# Patient Record
Sex: Female | Born: 1940 | Race: White | Hispanic: No | Marital: Married | Smoking: Former smoker
Health system: Southern US, Community
[De-identification: ages and names within clinical notes are randomized; demographics above are authoritative.]

## PROBLEM LIST (undated history)

## (undated) DIAGNOSIS — Z87891 Personal history of nicotine dependence: Secondary | ICD-10-CM

## (undated) DIAGNOSIS — N6019 Diffuse cystic mastopathy of unspecified breast: Secondary | ICD-10-CM

## (undated) DIAGNOSIS — Z1211 Encounter for screening for malignant neoplasm of colon: Secondary | ICD-10-CM

## (undated) DIAGNOSIS — I10 Essential (primary) hypertension: Secondary | ICD-10-CM

## (undated) DIAGNOSIS — M1990 Unspecified osteoarthritis, unspecified site: Secondary | ICD-10-CM

## (undated) DIAGNOSIS — R011 Cardiac murmur, unspecified: Secondary | ICD-10-CM

## (undated) DIAGNOSIS — F0390 Unspecified dementia without behavioral disturbance: Secondary | ICD-10-CM

## (undated) DIAGNOSIS — K219 Gastro-esophageal reflux disease without esophagitis: Secondary | ICD-10-CM

## (undated) HISTORY — DX: Encounter for screening for malignant neoplasm of colon: Z12.11

## (undated) HISTORY — DX: Personal history of nicotine dependence: Z87.891

## (undated) HISTORY — DX: Cardiac murmur, unspecified: R01.1

## (undated) HISTORY — PX: HERNIA REPAIR: SHX51

## (undated) HISTORY — DX: Essential (primary) hypertension: I10

## (undated) HISTORY — PX: BREAST BIOPSY: SHX20

## (undated) HISTORY — DX: Unspecified osteoarthritis, unspecified site: M19.90

## (undated) HISTORY — DX: Diffuse cystic mastopathy of unspecified breast: N60.19

## (undated) HISTORY — DX: Gastro-esophageal reflux disease without esophagitis: K21.9

---

## 1978-03-13 HISTORY — PX: ABDOMINAL HYSTERECTOMY: SHX81

## 2003-03-14 DIAGNOSIS — R011 Cardiac murmur, unspecified: Secondary | ICD-10-CM

## 2003-03-14 HISTORY — PX: BREAST SURGERY: SHX581

## 2003-03-14 HISTORY — PX: THYROIDECTOMY, PARTIAL: SHX18

## 2003-03-14 HISTORY — DX: Cardiac murmur, unspecified: R01.1

## 2004-11-03 ENCOUNTER — Ambulatory Visit: Payer: Self-pay | Admitting: General Surgery

## 2005-11-22 ENCOUNTER — Ambulatory Visit: Payer: Self-pay | Admitting: General Surgery

## 2006-11-26 ENCOUNTER — Ambulatory Visit: Payer: Self-pay | Admitting: General Surgery

## 2007-03-14 DIAGNOSIS — I10 Essential (primary) hypertension: Secondary | ICD-10-CM

## 2007-03-14 HISTORY — DX: Essential (primary) hypertension: I10

## 2007-11-28 ENCOUNTER — Ambulatory Visit: Payer: Self-pay | Admitting: General Surgery

## 2008-03-13 HISTORY — PX: COLONOSCOPY: SHX174

## 2008-11-30 ENCOUNTER — Ambulatory Visit: Payer: Self-pay | Admitting: General Surgery

## 2009-01-08 ENCOUNTER — Ambulatory Visit: Payer: Self-pay | Admitting: General Surgery

## 2009-03-13 DIAGNOSIS — M1990 Unspecified osteoarthritis, unspecified site: Secondary | ICD-10-CM

## 2009-03-13 HISTORY — DX: Unspecified osteoarthritis, unspecified site: M19.90

## 2009-12-02 ENCOUNTER — Ambulatory Visit: Payer: Self-pay | Admitting: General Surgery

## 2010-10-16 ENCOUNTER — Ambulatory Visit: Payer: Self-pay | Admitting: Internal Medicine

## 2010-12-05 ENCOUNTER — Ambulatory Visit: Payer: Self-pay | Admitting: General Surgery

## 2011-12-12 ENCOUNTER — Ambulatory Visit: Payer: Self-pay | Admitting: General Surgery

## 2012-08-19 ENCOUNTER — Encounter: Payer: Self-pay | Admitting: *Deleted

## 2012-12-12 ENCOUNTER — Ambulatory Visit: Payer: Self-pay | Admitting: General Surgery

## 2012-12-13 ENCOUNTER — Encounter: Payer: Self-pay | Admitting: General Surgery

## 2012-12-23 ENCOUNTER — Ambulatory Visit (INDEPENDENT_AMBULATORY_CARE_PROVIDER_SITE_OTHER): Payer: Medicare Other | Admitting: General Surgery

## 2012-12-23 ENCOUNTER — Encounter: Payer: Self-pay | Admitting: General Surgery

## 2012-12-23 VITALS — BP 130/82 | HR 64 | Resp 12 | Ht 67.0 in | Wt 148.0 lb

## 2012-12-23 DIAGNOSIS — Z1239 Encounter for other screening for malignant neoplasm of breast: Secondary | ICD-10-CM

## 2012-12-23 DIAGNOSIS — N6019 Diffuse cystic mastopathy of unspecified breast: Secondary | ICD-10-CM

## 2012-12-23 NOTE — Patient Instructions (Addendum)
Patient to continue self breast checks. She is to contact our office with any new questions or concerns. Patient to return in 1 year with bilateral screening mammogram.

## 2012-12-23 NOTE — Progress Notes (Signed)
Patient ID: Shannon Rios, female   DOB: 02-09-1941, 72 y.o.   MRN: [id number]  Chief Complaint  Patient presents with  . Other    follow up 1 year screening mammogram    HPI [name] is a 72 y.o. female who presents for a breast evaluation. The most recent mammogram was done on 12/12/12.  Patient does perform regular self breast checks and gets regular mammograms done.  The patient denies any problems with the breast at this time.    HPI  Past Medical History  Diagnosis Date  . GERD (gastroesophageal reflux disease)   . Hypertension 2009  . Personal history of tobacco use, presenting hazards to health   . Heart murmur 2005  . Diffuse cystic mastopathy   . Special screening for malignant neoplasms, colon   . 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 Thyroidectomy, partial  2005  . Abdominal hysterectomy  1980    with salpingo oophorectomy  . Breast surgery  2005    biopsy  . Hernia repair    . Colonoscopy  2010    Dr. Evette Cristal    History reviewed. No pertinent family history.  Social History History  Substance Use Topics  . Smoking status: Former Smoker -- 0.50 packs/day for 10 years    Types: Cigarettes  . Smokeless tobacco: Not on file  . Alcohol Use: Yes     Comment: socially    No Known Allergies  Current Outpatient Prescriptions  Medication Sig Dispense Refill  . benazepril (LOTENSIN) 40 MG tablet Take 1 tablet by mouth daily.      . Cholecalciferol (VITAMIN D-3) 1000 UNITS CAPS Take 1 capsule by mouth daily.      . Coenzyme Q10 (CO Q 10 PO) Take 200 mg by mouth daily.      . Cyanocobalamin (VITAMIN B 12 PO) Take 2,500 mg by mouth daily.      . Glucosamine Sulfate 1000 MG CAPS Take 1 capsule by mouth 2 (two) times daily.      . hydrochlorothiazide (HYDRODIURIL) 25 MG tablet Take 1 tablet by mouth daily.      . Multiple Vitamin (MULTIVITAMIN) tablet Take 1 tablet by mouth daily.      . Omega-3 Fatty Acids (FISH  OIL) 1200 MG CAPS Take 1 capsule by mouth as needed.      . Red Yeast Rice Extract (RED YEAST RICE PO) Take 1,200 mg by mouth daily.      . vitamin E 1000 UNIT capsule Take 1,000 Units by mouth as needed.       No current facility-administered medications for this visit.    Review of Systems Review of Systems  Constitutional: Negative.   Respiratory: Negative.   Cardiovascular: Negative.     Blood pressure 130/82, pulse 64, resp. rate 12, height 5\' 7"  (1.702 m), weight 148 lb (67.132 kg).  Physical Exam Physical Exam  Constitutional: She is oriented to person, place, and time. She appears well-developed and well-nourished.  Eyes: Conjunctivae are normal. No scleral icterus.  Neck: No thyromegaly present.  Cardiovascular: Normal rate, regular rhythm and normal heart sounds.   No murmur heard. Pulmonary/Chest: Effort normal and breath sounds normal. Right breast exhibits no inverted nipple, no mass, no nipple discharge, no skin change and no tenderness. Left breast exhibits no inverted nipple, no mass, no nipple discharge, no skin change and no tenderness.  Lymphadenopathy:    She has no  cervical adenopathy.    She has no axillary adenopathy.  Neurological: She is alert and oriented to person, place, and time.  Skin: Skin is warm and dry.    Data Reviewed Mammogram stable.   Assessment    Exam stable. History of FCD.    Plan    Patient to return in 1 year with bilateral screening mammogram.        Gerlene Burdock G 12/23/2012, 12:25 PM

## 2013-09-01 DIAGNOSIS — I10 Essential (primary) hypertension: Secondary | ICD-10-CM | POA: Insufficient documentation

## 2013-12-16 ENCOUNTER — Ambulatory Visit: Payer: Self-pay | Admitting: Internal Medicine

## 2014-01-07 DIAGNOSIS — R7302 Impaired glucose tolerance (oral): Secondary | ICD-10-CM | POA: Insufficient documentation

## 2014-01-07 DIAGNOSIS — E785 Hyperlipidemia, unspecified: Secondary | ICD-10-CM | POA: Insufficient documentation

## 2014-01-08 ENCOUNTER — Ambulatory Visit (INDEPENDENT_AMBULATORY_CARE_PROVIDER_SITE_OTHER): Payer: Medicare HMO | Admitting: General Surgery

## 2014-01-08 ENCOUNTER — Encounter: Payer: Self-pay | Admitting: General Surgery

## 2014-01-08 ENCOUNTER — Ambulatory Visit: Payer: Medicare Other | Admitting: General Surgery

## 2014-01-08 VITALS — BP 150/82 | HR 68 | Resp 12 | Ht 67.0 in | Wt 145.0 lb

## 2014-01-08 DIAGNOSIS — Z1239 Encounter for other screening for malignant neoplasm of breast: Secondary | ICD-10-CM

## 2014-01-08 DIAGNOSIS — N6019 Diffuse cystic mastopathy of unspecified breast: Secondary | ICD-10-CM

## 2014-01-08 NOTE — Progress Notes (Signed)
Patient ID: Shannon Rios Vegh, female   DOB: 10/19/1940, 73 y.o.   MRN: [id number]  Chief Complaint  Patient presents with  . Follow-up    1 year mammogram     HPI Shannon Rios Pannone is a 73 y.o. female who presents for a breast evaluation. The most recent mammogram was done on 12/16/13. Patient does perform regular self breast checks and gets regular mammograms done.  The patient denies any new problems with the breasts at this time.    HPI  Past Medical History  Diagnosis Date  . GERD (gastroesophageal reflux disease)   . Hypertension 2009  . Personal history of tobacco use, presenting hazards to health   . Heart murmur 2005  . Diffuse cystic mastopathy   . Special screening for malignant neoplasms, colon   . 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 Thyroidectomy, partial  2005  . Abdominal hysterectomy  1980    with salpingo oophorectomy  . Breast surgery  2005    biopsy  . Hernia repair    . Colonoscopy  2010    Dr. Evette CristalSankar    History reviewed. No pertinent family history.  Social History History  Substance Use Topics  . Smoking status: Former Smoker -- 0.50 packs/day for 10 years    Types: Cigarettes  . Smokeless tobacco: Not on file  . Alcohol Use: Yes     Comment: socially    No Known Allergies  Current Outpatient Prescriptions  Medication Sig Dispense Refill  . benazepril (LOTENSIN) 40 MG tablet Take 1 tablet by mouth daily.      . Cholecalciferol (VITAMIN D-3) 1000 UNITS CAPS Take 1 capsule by mouth daily.      . Glucosamine Sulfate 1000 MG CAPS Take 1 capsule by mouth 2 (two) times daily.      . hydrochlorothiazide (HYDRODIURIL) 25 MG tablet Take 1 tablet by mouth daily.      . Multiple Vitamin (MULTIVITAMIN) tablet Take 1 tablet by mouth daily.      . vitamin E 1000 UNIT capsule Take 1,000 Units by mouth as needed.       No current facility-administered medications for this visit.    Review of  Systems Review of Systems  Constitutional: Negative.   Respiratory: Negative.   Cardiovascular: Negative.     Blood pressure 150/82, pulse 68, resp. rate 12, height 5\' 7"  (1.702 m), weight 145 lb (65.772 kg).  Physical Exam Physical Exam  Constitutional: She is oriented to person, place, and time. She appears well-developed and well-nourished.  Eyes: Conjunctivae are normal. No scleral icterus.  Neck: Neck supple. No thyromegaly present.  Cardiovascular: Normal rate, regular rhythm and normal heart sounds.   No murmur heard. Pulmonary/Chest: Effort normal and breath sounds normal. Right breast exhibits no inverted nipple, no mass, no nipple discharge, no skin change and no tenderness. Left breast exhibits no inverted nipple, no mass, no nipple discharge, no skin change and no tenderness.  Abdominal: Soft. Bowel sounds are normal. There is no tenderness.  Lymphadenopathy:    She has no cervical adenopathy.    She has no axillary adenopathy.  Neurological: She is alert and oriented to person, place, and time.  Skin: Skin is warm and dry.    Data Reviewed  Mammogram reviewed and stable.   Assessment    Stable exam. FCD.     Plan    Patient to return in 1 year with bilateral screening  mammogram.        Kieth BrightlySANKAR,Eldonna Neuenfeldt G 01/08/2014, 6:29 PM

## 2014-01-08 NOTE — Patient Instructions (Signed)
Patient to return in 1 year with bilateral screening mammogram. Continue self breast exams. Call office for any new breast issues or concerns.  

## 2014-01-12 ENCOUNTER — Encounter: Payer: Self-pay | Admitting: General Surgery

## 2014-01-12 DIAGNOSIS — J309 Allergic rhinitis, unspecified: Secondary | ICD-10-CM | POA: Insufficient documentation

## 2014-01-12 DIAGNOSIS — M8580 Other specified disorders of bone density and structure, unspecified site: Secondary | ICD-10-CM | POA: Insufficient documentation

## 2014-07-15 DIAGNOSIS — E559 Vitamin D deficiency, unspecified: Secondary | ICD-10-CM | POA: Insufficient documentation

## 2014-10-26 ENCOUNTER — Other Ambulatory Visit: Payer: Self-pay

## 2014-10-26 DIAGNOSIS — Z1231 Encounter for screening mammogram for malignant neoplasm of breast: Secondary | ICD-10-CM

## 2014-12-22 ENCOUNTER — Ambulatory Visit
Admission: RE | Admit: 2014-12-22 | Discharge: 2014-12-22 | Disposition: A | Discharge status: Home / Self Care | Payer: Medicare PPO | Source: Ambulatory Visit | Attending: Internal Medicine | Admitting: Internal Medicine

## 2014-12-22 DIAGNOSIS — Z1231 Encounter for screening mammogram for malignant neoplasm of breast: Secondary | ICD-10-CM | POA: Diagnosis present

## 2014-12-29 ENCOUNTER — Ambulatory Visit: Payer: Medicare HMO | Admitting: General Surgery

## 2016-01-10 ENCOUNTER — Other Ambulatory Visit: Payer: Self-pay | Admitting: Internal Medicine

## 2016-01-18 DIAGNOSIS — M5030 Other cervical disc degeneration, unspecified cervical region: Secondary | ICD-10-CM | POA: Insufficient documentation

## 2016-06-04 DIAGNOSIS — F03B18 Unspecified dementia, moderate, with other behavioral disturbance: Secondary | ICD-10-CM | POA: Insufficient documentation

## 2016-06-04 DIAGNOSIS — F0391 Unspecified dementia with behavioral disturbance: Secondary | ICD-10-CM | POA: Insufficient documentation

## 2016-07-19 DIAGNOSIS — M170 Bilateral primary osteoarthritis of knee: Secondary | ICD-10-CM | POA: Insufficient documentation

## 2016-07-31 ENCOUNTER — Other Ambulatory Visit: Payer: Self-pay | Admitting: Gastroenterology

## 2016-07-31 DIAGNOSIS — R1310 Dysphagia, unspecified: Secondary | ICD-10-CM

## 2016-08-03 ENCOUNTER — Ambulatory Visit: Payer: Medicare PPO

## 2016-08-08 ENCOUNTER — Ambulatory Visit
Admission: RE | Admit: 2016-08-08 | Discharge: 2016-08-08 | Disposition: A | Discharge status: Home / Self Care | Payer: Medicare PPO | Source: Ambulatory Visit | Attending: Gastroenterology | Admitting: Gastroenterology

## 2016-08-08 DIAGNOSIS — R1310 Dysphagia, unspecified: Secondary | ICD-10-CM

## 2016-08-08 DIAGNOSIS — K449 Diaphragmatic hernia without obstruction or gangrene: Secondary | ICD-10-CM | POA: Insufficient documentation

## 2016-08-08 DIAGNOSIS — R1314 Dysphagia, pharyngoesophageal phase: Secondary | ICD-10-CM | POA: Diagnosis present

## 2016-09-04 ENCOUNTER — Ambulatory Visit: Admission: RE | Admit: 2016-09-04 | Payer: Medicare PPO | Source: Ambulatory Visit | Admitting: Gastroenterology

## 2016-09-04 ENCOUNTER — Encounter: Admission: RE | Payer: Self-pay | Source: Ambulatory Visit

## 2016-09-04 SURGERY — ESOPHAGOGASTRODUODENOSCOPY (EGD) WITH PROPOFOL
Anesthesia: General

## 2016-09-08 DIAGNOSIS — M1712 Unilateral primary osteoarthritis, left knee: Secondary | ICD-10-CM | POA: Insufficient documentation

## 2016-09-08 DIAGNOSIS — M1711 Unilateral primary osteoarthritis, right knee: Secondary | ICD-10-CM | POA: Insufficient documentation

## 2016-09-25 DIAGNOSIS — F39 Unspecified mood [affective] disorder: Secondary | ICD-10-CM | POA: Insufficient documentation

## 2016-09-26 ENCOUNTER — Other Ambulatory Visit: Payer: Self-pay | Admitting: Orthopedic Surgery

## 2016-09-26 DIAGNOSIS — M2392 Unspecified internal derangement of left knee: Secondary | ICD-10-CM

## 2016-09-26 DIAGNOSIS — M1712 Unilateral primary osteoarthritis, left knee: Secondary | ICD-10-CM

## 2016-10-03 ENCOUNTER — Ambulatory Visit: Payer: Medicare PPO

## 2016-10-05 ENCOUNTER — Ambulatory Visit
Admission: RE | Admit: 2016-10-05 | Discharge: 2016-10-05 | Disposition: A | Discharge status: Home / Self Care | Payer: Medicare PPO | Source: Ambulatory Visit | Attending: Orthopedic Surgery | Admitting: Orthopedic Surgery

## 2016-10-05 DIAGNOSIS — M2392 Unspecified internal derangement of left knee: Secondary | ICD-10-CM

## 2016-10-05 DIAGNOSIS — M84462A Pathological fracture, left tibia, initial encounter for fracture: Secondary | ICD-10-CM | POA: Diagnosis not present

## 2016-10-05 DIAGNOSIS — S83282A Other tear of lateral meniscus, current injury, left knee, initial encounter: Secondary | ICD-10-CM | POA: Diagnosis not present

## 2016-10-05 DIAGNOSIS — M25461 Effusion, right knee: Secondary | ICD-10-CM | POA: Diagnosis not present

## 2016-10-05 DIAGNOSIS — S83242A Other tear of medial meniscus, current injury, left knee, initial encounter: Secondary | ICD-10-CM | POA: Diagnosis not present

## 2016-10-05 DIAGNOSIS — X58XXXA Exposure to other specified factors, initial encounter: Secondary | ICD-10-CM | POA: Insufficient documentation

## 2016-10-05 DIAGNOSIS — M948X6 Other specified disorders of cartilage, lower leg: Secondary | ICD-10-CM | POA: Insufficient documentation

## 2016-10-05 DIAGNOSIS — M1712 Unilateral primary osteoarthritis, left knee: Secondary | ICD-10-CM | POA: Insufficient documentation

## 2016-10-26 ENCOUNTER — Other Ambulatory Visit: Payer: Self-pay | Admitting: Gastroenterology

## 2016-10-26 DIAGNOSIS — R1013 Epigastric pain: Secondary | ICD-10-CM

## 2016-10-31 ENCOUNTER — Ambulatory Visit
Admission: RE | Admit: 2016-10-31 | Discharge: 2016-10-31 | Disposition: A | Discharge status: Home / Self Care | Payer: Medicare PPO | Source: Ambulatory Visit | Attending: Gastroenterology | Admitting: Gastroenterology

## 2016-10-31 DIAGNOSIS — K219 Gastro-esophageal reflux disease without esophagitis: Secondary | ICD-10-CM | POA: Diagnosis not present

## 2016-10-31 DIAGNOSIS — K449 Diaphragmatic hernia without obstruction or gangrene: Secondary | ICD-10-CM | POA: Diagnosis not present

## 2016-10-31 DIAGNOSIS — K228 Other specified diseases of esophagus: Secondary | ICD-10-CM | POA: Diagnosis not present

## 2016-10-31 DIAGNOSIS — R1013 Epigastric pain: Secondary | ICD-10-CM | POA: Diagnosis present

## 2017-02-08 ENCOUNTER — Ambulatory Visit: Payer: Medicare PPO

## 2017-02-08 ENCOUNTER — Encounter (INDEPENDENT_AMBULATORY_CARE_PROVIDER_SITE_OTHER): Payer: Self-pay

## 2017-02-08 ENCOUNTER — Other Ambulatory Visit
Admission: RE | Admit: 2017-02-08 | Discharge: 2017-02-08 | Disposition: A | Discharge status: Home / Self Care | Payer: Medicare PPO | Source: Ambulatory Visit | Attending: Unknown Physician Specialty | Admitting: Unknown Physician Specialty

## 2017-02-08 ENCOUNTER — Other Ambulatory Visit: Payer: Self-pay | Admitting: Unknown Physician Specialty

## 2017-02-08 ENCOUNTER — Other Ambulatory Visit (INDEPENDENT_AMBULATORY_CARE_PROVIDER_SITE_OTHER): Payer: Medicare HMO

## 2017-02-08 ENCOUNTER — Other Ambulatory Visit (INDEPENDENT_AMBULATORY_CARE_PROVIDER_SITE_OTHER): Payer: Self-pay | Admitting: Unknown Physician Specialty

## 2017-02-08 DIAGNOSIS — M7989 Other specified soft tissue disorders: Principal | ICD-10-CM

## 2017-02-08 DIAGNOSIS — M1712 Unilateral primary osteoarthritis, left knee: Secondary | ICD-10-CM | POA: Insufficient documentation

## 2017-02-08 DIAGNOSIS — M79662 Pain in left lower leg: Secondary | ICD-10-CM

## 2017-02-08 LAB — SYNOVIAL CELL COUNT + DIFF, W/ CRYSTALS
CRYSTALS FLUID: NONE SEEN
EOSINOPHILS-SYNOVIAL: 1 %
LYMPHOCYTES-SYNOVIAL FLD: 33 %
MONOCYTE-MACROPHAGE-SYNOVIAL FLUID: 23 %
NEUTROPHIL, SYNOVIAL: 43 %
WBC, SYNOVIAL: 169 /mm3 (ref 0–200)

## 2017-07-24 DIAGNOSIS — I890 Lymphedema, not elsewhere classified: Secondary | ICD-10-CM | POA: Insufficient documentation

## 2017-08-30 ENCOUNTER — Other Ambulatory Visit: Payer: Self-pay | Admitting: Specialist

## 2017-08-30 DIAGNOSIS — R05 Cough: Secondary | ICD-10-CM

## 2017-08-30 DIAGNOSIS — R0602 Shortness of breath: Secondary | ICD-10-CM

## 2017-08-30 DIAGNOSIS — R053 Chronic cough: Secondary | ICD-10-CM

## 2017-08-30 DIAGNOSIS — R942 Abnormal results of pulmonary function studies: Secondary | ICD-10-CM

## 2017-09-07 ENCOUNTER — Ambulatory Visit
Admission: RE | Admit: 2017-09-07 | Discharge: 2017-09-07 | Disposition: A | Discharge status: Home / Self Care | Payer: Medicare PPO | Source: Ambulatory Visit | Attending: Specialist | Admitting: Specialist

## 2017-09-07 DIAGNOSIS — R05 Cough: Secondary | ICD-10-CM | POA: Diagnosis present

## 2017-09-07 DIAGNOSIS — K219 Gastro-esophageal reflux disease without esophagitis: Secondary | ICD-10-CM | POA: Diagnosis not present

## 2017-09-07 DIAGNOSIS — I700 Atherosclerosis of aorta: Secondary | ICD-10-CM | POA: Diagnosis not present

## 2017-09-07 DIAGNOSIS — R942 Abnormal results of pulmonary function studies: Secondary | ICD-10-CM | POA: Diagnosis present

## 2017-09-07 DIAGNOSIS — R0602 Shortness of breath: Secondary | ICD-10-CM | POA: Insufficient documentation

## 2017-09-07 DIAGNOSIS — K449 Diaphragmatic hernia without obstruction or gangrene: Secondary | ICD-10-CM | POA: Insufficient documentation

## 2017-09-07 DIAGNOSIS — R053 Chronic cough: Secondary | ICD-10-CM

## 2017-09-20 DIAGNOSIS — R32 Unspecified urinary incontinence: Secondary | ICD-10-CM | POA: Insufficient documentation

## 2017-12-08 ENCOUNTER — Emergency Department
Admission: EM | Admit: 2017-12-08 | Discharge: 2017-12-09 | Disposition: A | Discharge status: Home / Self Care | Payer: Medicare PPO | Attending: Student in an Organized Health Care Education/Training Program | Admitting: Student in an Organized Health Care Education/Training Program

## 2017-12-08 ENCOUNTER — Encounter: Payer: Self-pay | Admitting: Emergency Medicine

## 2017-12-08 ENCOUNTER — Other Ambulatory Visit: Payer: Self-pay

## 2017-12-08 DIAGNOSIS — F17210 Nicotine dependence, cigarettes, uncomplicated: Secondary | ICD-10-CM | POA: Insufficient documentation

## 2017-12-08 DIAGNOSIS — I10 Essential (primary) hypertension: Secondary | ICD-10-CM | POA: Diagnosis not present

## 2017-12-08 DIAGNOSIS — F0390 Unspecified dementia without behavioral disturbance: Secondary | ICD-10-CM | POA: Insufficient documentation

## 2017-12-08 DIAGNOSIS — N3001 Acute cystitis with hematuria: Secondary | ICD-10-CM

## 2017-12-08 DIAGNOSIS — Z79899 Other long term (current) drug therapy: Secondary | ICD-10-CM | POA: Diagnosis not present

## 2017-12-08 DIAGNOSIS — R319 Hematuria, unspecified: Secondary | ICD-10-CM | POA: Diagnosis present

## 2017-12-08 HISTORY — DX: Unspecified dementia, unspecified severity, without behavioral disturbance, psychotic disturbance, mood disturbance, and anxiety: F03.90

## 2017-12-08 LAB — URINALYSIS, COMPLETE (UACMP) WITH MICROSCOPIC
Bacteria, UA: NONE SEEN
SQUAMOUS EPITHELIAL / LPF: NONE SEEN (ref 0–5)
Specific Gravity, Urine: 1.018 (ref 1.005–1.030)

## 2017-12-08 LAB — CBC WITH DIFFERENTIAL/PLATELET
Basophils Absolute: 0.1 10*3/uL (ref 0–0.1)
Basophils Relative: 1 %
EOS PCT: 0 %
Eosinophils Absolute: 0 10*3/uL (ref 0–0.7)
HEMATOCRIT: 35.5 % (ref 35.0–47.0)
Hemoglobin: 12.5 g/dL (ref 12.0–16.0)
LYMPHS ABS: 1.4 10*3/uL (ref 1.0–3.6)
LYMPHS PCT: 12 %
MCH: 32.5 pg (ref 26.0–34.0)
MCHC: 35.3 g/dL (ref 32.0–36.0)
MCV: 91.9 fL (ref 80.0–100.0)
MONO ABS: 1 10*3/uL — AB (ref 0.2–0.9)
Monocytes Relative: 9 %
NEUTROS ABS: 9.5 10*3/uL — AB (ref 1.4–6.5)
Neutrophils Relative %: 78 %
PLATELETS: 368 10*3/uL (ref 150–440)
RBC: 3.86 MIL/uL (ref 3.80–5.20)
RDW: 14.1 % (ref 11.5–14.5)
WBC: 12.1 10*3/uL — ABNORMAL HIGH (ref 3.6–11.0)

## 2017-12-08 LAB — BASIC METABOLIC PANEL
ANION GAP: 11 (ref 5–15)
BUN: 22 mg/dL (ref 8–23)
CALCIUM: 9.5 mg/dL (ref 8.9–10.3)
CO2: 31 mmol/L (ref 22–32)
Chloride: 92 mmol/L — ABNORMAL LOW (ref 98–111)
Creatinine, Ser: 0.9 mg/dL (ref 0.44–1.00)
GFR calc Af Amer: >60 mL/min (ref >60)
GFR calc non Af Amer: >60 mL/min (ref >60)
GLUCOSE: 134 mg/dL — AB (ref 70–99)
POTASSIUM: 3.1 mmol/L — AB (ref 3.5–5.1)
Sodium: 134 mmol/L — ABNORMAL LOW (ref 135–145)

## 2017-12-08 MED ORDER — SODIUM CHLORIDE 0.9 % IV SOLN
1.0000 g | Freq: Once | INTRAVENOUS | Status: AC
  Administered 2017-12-08: 1 g via INTRAVENOUS
  Filled 2017-12-08: qty 10

## 2017-12-08 MED ORDER — CEPHALEXIN 500 MG PO CAPS: 500.0000 mg | ORAL_CAPSULE | Freq: Three times a day (TID) | ORAL | 0 refills | Status: AC

## 2017-12-08 NOTE — ED Triage Notes (Signed)
Pt arrives POV to triage with c/o hematuria. Pt and spouse reports blood in urine with painful urination. Pt is in NAD.

## 2017-12-08 NOTE — ED Provider Notes (Signed)
[facility] Emergency Department Provider Note    First MD Initiated Contact with Patient 12/08/17 2023     (approximate)  I have reviewed the triage vital signs and the nursing notes.   HISTORY  Chief Complaint Hematuria  Level V Caveat:  dementia  HPI Shannon Rios is a 77 y.o. female presents the ER with 1 day of worsening hematuria and dysuria.  No recent antibiotics.  No confusion.  No fevers.  No nausea or vomiting.  States she does have significant discomfort when emptying her bladder.  She accompanied by family member.  No other concerns reported.    Past Medical History:  Diagnosis Date  . Dementia   . Diffuse cystic mastopathy   . GERD (gastroesophageal reflux disease)   . Heart murmur 2005  . Hypertension 2009  . 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 Personal history of tobacco use, presenting hazards to health   . Special screening for malignant neoplasms, colon    No family history on file. Past Surgical History:  Procedure Laterality Date  . ABDOMINAL HYSTERECTOMY  1980   with salpingo oophorectomy  . BREAST BIOPSY Bilateral    neg  . BREAST SURGERY  2005   biopsy  . COLONOSCOPY  2010   Dr. Evette Cristal  . HERNIA REPAIR    . THYROIDECTOMY, PARTIAL  2005   Patient Active Problem List   Diagnosis Date Noted  . Diffuse cystic mastopathy 12/23/2012      Prior to Admission medications   Medication Sig Start Date End Date Taking? Authorizing Provider  benazepril (LOTENSIN) 40 MG tablet Take 1 tablet by mouth daily. 10/24/12   [provider]  cephALEXin (KEFLEX) 500 MG capsule Take 1 capsule (500 mg total) by mouth 3 (three) times daily for 7 days. 12/08/17 12/15/17  Willy Eddy, MD  Cholecalciferol (VITAMIN D-3) 1000 UNITS CAPS Take 1 capsule by mouth daily.    [provider]  Glucosamine Sulfate 1000 MG CAPS Take 1 capsule by mouth 2 (two) times daily.    [provider]  hydrochlorothiazide  (HYDRODIURIL) 25 MG tablet Take 1 tablet by mouth daily. 11/16/12   [provider]  Multiple Vitamin (MULTIVITAMIN) tablet Take 1 tablet by mouth daily.    [provider]  vitamin E 1000 UNIT capsule Take 1,000 Units by mouth as needed.    [provider]    Allergies Patient has no known allergies.    Social History Social History   Tobacco Use  . Smoking status: Former Smoker    Packs/day: 0.50    Years: 10.00    Pack years: 5.00    Types: Cigarettes  . Smokeless tobacco: Never Used  Substance Use Topics  . Alcohol use: Yes    Comment: socially  . Drug use: No    Review of Systems Patient denies headaches, rhinorrhea, blurry vision, numbness, shortness of breath, chest pain, edema, cough, abdominal pain, nausea, vomiting, diarrhea, dysuria, fevers, rashes or hallucinations unless otherwise stated above in HPI. ____________________________________________   PHYSICAL EXAM:  VITAL SIGNS: Vitals:   12/08/17 2008 12/08/17 2257  BP: (!) 161/86 (!) 160/111  Pulse: 93 72  Resp: 18 18  Temp: 98.7 F (37.1 C)   SpO2: 100% 97%    Constitutional: Alert, pleasant  Eyes: Conjunctivae are normal.  Head: Atraumatic. Nose: No congestion/rhinnorhea. Mouth/Throat: Mucous membranes are moist.   Neck: No stridor. Painless ROM.  Cardiovascular: Normal rate, regular rhythm. Grossly normal heart sounds.  Good peripheral circulation. Respiratory: Normal respiratory effort.  No retractions. Lungs CTAB. Gastrointestinal: Soft and nontender. No distention. No abdominal bruits. No CVA tenderness. Genitourinary: deferred Musculoskeletal: No lower extremity tenderness nor edema.  No joint effusions. Neurologic:  Normal speech and language. No gross focal neurologic deficits are appreciated. No facial droop Skin:  Skin is warm, dry and intact. No rash noted. Psychiatric: Mood and affect are normal. Speech and behavior are  normal.  ____________________________________________   LABS (all labs ordered are listed, but only abnormal results are displayed)  Results for orders placed or performed during the hospital encounter of 12/08/17 (from the past 24 hour(s))  Urinalysis, Complete w Microscopic     Status: Abnormal   Collection Time: 12/08/17  8:39 PM  Result Value Ref Range   Color, Urine RED (A) YELLOW   APPearance CLOUDY (A) CLEAR   Specific Gravity, Urine 1.018 1.005 - 1.030   pH  5.0 - 8.0    TEST NOT REPORTED DUE TO COLOR INTERFERENCE OF URINE PIGMENT   Glucose, UA (A) NEGATIVE mg/dL    TEST NOT REPORTED DUE TO COLOR INTERFERENCE OF URINE PIGMENT   Hgb urine dipstick (A) NEGATIVE    TEST NOT REPORTED DUE TO COLOR INTERFERENCE OF URINE PIGMENT   Bilirubin Urine (A) NEGATIVE    TEST NOT REPORTED DUE TO COLOR INTERFERENCE OF URINE PIGMENT   Ketones, ur (A) NEGATIVE mg/dL    TEST NOT REPORTED DUE TO COLOR INTERFERENCE OF URINE PIGMENT   Protein, ur (A) NEGATIVE mg/dL    TEST NOT REPORTED DUE TO COLOR INTERFERENCE OF URINE PIGMENT   Nitrite (A) NEGATIVE    TEST NOT REPORTED DUE TO COLOR INTERFERENCE OF URINE PIGMENT   Leukocytes, UA (A) NEGATIVE    TEST NOT REPORTED DUE TO COLOR INTERFERENCE OF URINE PIGMENT   RBC / HPF >50 (H) 0 - 5 RBC/hpf   WBC, UA >50 (H) 0 - 5 WBC/hpf   Bacteria, UA NONE SEEN NONE SEEN   Squamous Epithelial / LPF NONE SEEN 0 - 5   WBC Clumps PRESENT    Non Squamous Epithelial PRESENT (A) NONE SEEN  CBC with Differential/Platelet     Status: Abnormal   Collection Time: 12/08/17  8:44 PM  Result Value Ref Range   WBC 12.1 (H) 3.6 - 11.0 K/uL   RBC 3.86 3.80 - 5.20 MIL/uL   Hemoglobin 12.5 12.0 - 16.0 g/dL   HCT 16.1 09.6 - 04.5 %   MCV 91.9 80.0 - 100.0 fL   MCH 32.5 26.0 - 34.0 pg   MCHC 35.3 32.0 - 36.0 g/dL   RDW 40.9 81.1 - 91.4 %   Platelets 368 150 - 440 K/uL   Neutrophils Relative % 78 %   Neutro Abs 9.5 (H) 1.4 - 6.5 K/uL   Lymphocytes Relative 12 %    Lymphs Abs 1.4 1.0 - 3.6 K/uL   Monocytes Relative 9 %   Monocytes Absolute 1.0 (H) 0.2 - 0.9 K/uL   Eosinophils Relative 0 %   Eosinophils Absolute 0.0 0 - 0.7 K/uL   Basophils Relative 1 %   Basophils Absolute 0.1 0 - 0.1 K/uL  Basic metabolic panel     Status: Abnormal   Collection Time: 12/08/17  8:44 PM  Result Value Ref Range   Sodium 134 (L) 135 - 145 mmol/L   Potassium 3.1 (L) 3.5 - 5.1 mmol/L   Chloride 92 (L) 98 - 111 mmol/L   CO2 31 22 - 32 mmol/L  Glucose, Bld 134 (H) 70 - 99 mg/dL   BUN 22 8 - 23 mg/dL   Creatinine, Ser 9.60 0.44 - 1.00 mg/dL   Calcium 9.5 8.9 - 45.4 mg/dL   GFR calc non Af Amer >60 >60 mL/min   GFR calc Af Amer >60 >60 mL/min   Anion gap 11 5 - 15   ____________________________________________ ______________________________  RADIOLOGY   ____________________________________________   PROCEDURES  Procedure(s) performed:  Procedures    Critical Care performed: no ____________________________________________   INITIAL IMPRESSION / ASSESSMENT AND PLAN / ED COURSE  Pertinent labs & imaging results that were available during my care of the patient were reviewed by me and considered in my medical decision making (see chart for details).   DDX: uti, cystitis, stone, malignancy, aki  Callia Swim is a 77 y.o. who presents to the ED with dysuria and hematuria starting today.  We will check urine as well as blood work.  Anticipate will have a urinary tract infection based on her presentation.  Does not appear septic or toxic at this time.  Clinical Course as of Dec 08 2308  Sat Dec 08, 2017  2118 Given hematuria and dysuria will initiate IV antibiotics with Rocephin.  Not febrile not tachycardic.  Will send urine for culture.   [PR]  2135 Repeat abdominal exam soft benign.  Patient without any history of kidney stones.  Will give dose of antibiotics and once completed do believe she will be stable and appropriate for discharge home.    [PR]    Clinical Course User Index [PR] Willy Eddy, MD     As part of my medical decision making, I reviewed the following data within the electronic MEDICAL RECORD NUMBER Nursing notes reviewed and incorporated, Labs reviewed, notes from prior ED visits.  ____________________________________________   FINAL CLINICAL IMPRESSION(S) / ED DIAGNOSES  Final diagnoses:  Acute cystitis with hematuria      NEW MEDICATIONS STARTED DURING THIS VISIT:  New Prescriptions   CEPHALEXIN (KEFLEX) 500 MG CAPSULE    Take 1 capsule (500 mg total) by mouth 3 (three) times daily for 7 days.     Note:  This document was prepared using Dragon voice recognition software and may include unintentional dictation errors.    Willy Eddy, MD 12/08/17 [phone]

## 2017-12-08 NOTE — ED Notes (Signed)
Pt is going to try to void at bedside at this time .

## 2017-12-11 LAB — URINE CULTURE: Culture: >=100000 — AB

## 2017-12-23 ENCOUNTER — Other Ambulatory Visit: Payer: Self-pay

## 2017-12-23 ENCOUNTER — Emergency Department
Admission: EM | Admit: 2017-12-23 | Discharge: 2017-12-23 | Disposition: A | Discharge status: Home / Self Care | Payer: Medicare PPO | Attending: Emergency Medicine | Admitting: Emergency Medicine

## 2017-12-23 DIAGNOSIS — F0390 Unspecified dementia without behavioral disturbance: Secondary | ICD-10-CM | POA: Diagnosis not present

## 2017-12-23 DIAGNOSIS — Z87891 Personal history of nicotine dependence: Secondary | ICD-10-CM | POA: Diagnosis not present

## 2017-12-23 DIAGNOSIS — Z79899 Other long term (current) drug therapy: Secondary | ICD-10-CM | POA: Insufficient documentation

## 2017-12-23 DIAGNOSIS — I10 Essential (primary) hypertension: Secondary | ICD-10-CM | POA: Insufficient documentation

## 2017-12-23 DIAGNOSIS — N390 Urinary tract infection, site not specified: Secondary | ICD-10-CM | POA: Diagnosis not present

## 2017-12-23 DIAGNOSIS — R350 Frequency of micturition: Secondary | ICD-10-CM | POA: Diagnosis present

## 2017-12-23 LAB — CBC
HCT: 37.1 % (ref 36.0–46.0)
Hemoglobin: 12.6 g/dL (ref 12.0–15.0)
MCH: 31.2 pg (ref 26.0–34.0)
MCHC: 34 g/dL (ref 30.0–36.0)
MCV: 91.8 fL (ref 80.0–100.0)
NRBC: 0 % (ref 0.0–0.2)
PLATELETS: 350 10*3/uL (ref 150–400)
RBC: 4.04 MIL/uL (ref 3.87–5.11)
RDW: 13.4 % (ref 11.5–15.5)
WBC: 11.8 10*3/uL — ABNORMAL HIGH (ref 4.0–10.5)

## 2017-12-23 LAB — BASIC METABOLIC PANEL
ANION GAP: 11 (ref 5–15)
BUN: 14 mg/dL (ref 8–23)
CALCIUM: 9.5 mg/dL (ref 8.9–10.3)
CO2: 32 mmol/L (ref 22–32)
CREATININE: 0.9 mg/dL (ref 0.44–1.00)
Chloride: 89 mmol/L — ABNORMAL LOW (ref 98–111)
GFR calc Af Amer: >60 mL/min (ref >60)
Glucose, Bld: 153 mg/dL — ABNORMAL HIGH (ref 70–99)
Potassium: 3.2 mmol/L — ABNORMAL LOW (ref 3.5–5.1)
SODIUM: 132 mmol/L — AB (ref 135–145)

## 2017-12-23 LAB — URINALYSIS, COMPLETE (UACMP) WITH MICROSCOPIC
BACTERIA UA: NONE SEEN
BILIRUBIN URINE: NEGATIVE
Glucose, UA: NEGATIVE mg/dL
KETONES UR: 5 mg/dL — AB
Nitrite: NEGATIVE
PROTEIN: 100 mg/dL — AB
SQUAMOUS EPITHELIAL / LPF: NONE SEEN (ref 0–5)
Specific Gravity, Urine: 1.02 (ref 1.005–1.030)
pH: 5 (ref 5.0–8.0)

## 2017-12-23 MED ORDER — NITROFURANTOIN MONOHYD MACRO 100 MG PO CAPS: 100.0000 mg | ORAL_CAPSULE | Freq: Two times a day (BID) | ORAL | 0 refills | Status: AC

## 2017-12-23 MED ORDER — NITROFURANTOIN MONOHYD MACRO 100 MG PO CAPS
100.0000 mg | ORAL_CAPSULE | Freq: Once | ORAL | Status: AC
  Administered 2017-12-23: 100 mg via ORAL
  Filled 2017-12-23: qty 1

## 2017-12-23 NOTE — ED Triage Notes (Signed)
Pt is here with her husband who c/o blood in urine that started today, painful urination, states she was here in the past couple of weeks with same sx and dx with UTI.Marland Kitchen

## 2017-12-23 NOTE — ED Provider Notes (Signed)
[facility] Emergency Department Provider Note   ____________________________________________    I have reviewed the triage vital signs and the nursing notes.   HISTORY  Chief Complaint Urinary Frequency  She has a history of dementia   HPI Shannon Rios is a 77 y.o. female who presents with dysuria and urinary frequency and lower abdominal discomfort.  Family reports this is quite consistent with symptoms that she has had in the past with urinary tract infection.  No reports of fevers.  No vomiting.  No change in mental status.  Patient treated successfully with Keflex several weeks ago for UTI with significant improvement.   Past Medical History:  Diagnosis Date  . Dementia (HCC)   . Diffuse cystic mastopathy   . GERD (gastroesophageal reflux disease)   . Heart murmur 2005  . Hypertension 2009  . 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 Personal history of tobacco use, presenting hazards to health   . Special screening for malignant neoplasms, colon     Patient Active Problem List   Diagnosis Date Noted  . Diffuse cystic mastopathy 12/23/2012    Past Surgical History:  Procedure Laterality Date  . ABDOMINAL HYSTERECTOMY  1980   with salpingo oophorectomy  . BREAST BIOPSY Bilateral    neg  . BREAST SURGERY  2005   biopsy  . COLONOSCOPY  2010   Dr. Evette Cristal  . HERNIA REPAIR    . THYROIDECTOMY, PARTIAL  2005    Prior to Admission medications   Medication Sig Start Date End Date Taking? Authorizing Provider  benazepril (LOTENSIN) 40 MG tablet Take 1 tablet by mouth daily. 10/24/12   [provider]  Cholecalciferol (VITAMIN D-3) 1000 UNITS CAPS Take 1 capsule by mouth daily.    [provider]  Glucosamine Sulfate 1000 MG CAPS Take 1 capsule by mouth 2 (two) times daily.    [provider]  hydrochlorothiazide (HYDRODIURIL) 25 MG tablet Take 1 tablet by mouth daily. 11/16/12   [provider]  Multiple  Vitamin (MULTIVITAMIN) tablet Take 1 tablet by mouth daily.    [provider]  nitrofurantoin, macrocrystal-monohydrate, (MACROBID) 100 MG capsule Take 1 capsule (100 mg total) by mouth 2 (two) times daily for 10 days. 12/23/17 01/02/18  Jene Every, MD  vitamin E 1000 UNIT capsule Take 1,000 Units by mouth as needed.    [provider]     Allergies Patient has no known allergies.  No family history on file.  Social History Social History   Tobacco Use  . Smoking status: Former Smoker    Packs/day: 0.50    Years: 10.00    Pack years: 5.00    Types: Cigarettes  . Smokeless tobacco: Never Used  Substance Use Topics  . Alcohol use: Yes    Comment: socially  . Drug use: No    Review of Systems  Constitutional: No fever reported Eyes: No discharge ENT: No sore throat. Cardiovascular: Denies chest pain. Respiratory: Denies shortness of breath. Gastrointestinal: As above Genitourinary: As above Musculoskeletal: Negative for back pain. Skin: Negative for rash. Neurological: Negative for headaches    ____________________________________________   PHYSICAL EXAM:  VITAL SIGNS: ED Triage Vitals  Enc Vitals Group     BP 12/23/17 1817 (!) 136/100     Pulse Rate 12/23/17 1817 82     Resp 12/23/17 1817 15     Temp 12/23/17 1817 98.6 F (37 C)     Temp Source 12/23/17 1817 Oral  SpO2 12/23/17 1817 96 %     Weight 12/23/17 1817 81.6 kg (180 lb)     Height 12/23/17 1817 1.676 m (5\' 6" )     Head Circumference --      Peak Flow --      Pain Score 12/23/17 1827 6     Pain Loc --      Pain Edu? --      Excl. in GC? --     Constitutional: Alert, no acute distress Eyes: Conjunctivae are normal.   Nose: No congestion/rhinnorhea. Mouth/Throat: Mucous membranes are moist.    Cardiovascular: Normal rate, regular rhythm. Grossly normal heart sounds.  Good peripheral circulation. Respiratory: Normal respiratory effort.  No retractions. Lungs  CTAB. Gastrointestinal: Soft and nontender. No distention.  No CVA tenderness.  Musculoskeletal: No lower extremity tenderness nor edema.  Warm and well perfused Neurologic:  Normal speech and language. No gross focal neurologic deficits are appreciated.  Skin:  Skin is warm, dry and intact. No rash noted. .  ____________________________________________   LABS (all labs ordered are listed, but only abnormal results are displayed)  Labs Reviewed  URINALYSIS, COMPLETE (UACMP) WITH MICROSCOPIC - Abnormal; Notable for the following components:      Result Value   Color, Urine AMBER (*)    APPearance CLOUDY (*)    Hgb urine dipstick LARGE (*)    Ketones, ur 5 (*)    Protein, ur 100 (*)    Leukocytes, UA MODERATE (*)    RBC / HPF >50 (*)    WBC, UA >50 (*)    All other components within normal limits  BASIC METABOLIC PANEL - Abnormal; Notable for the following components:   Sodium 132 (*)    Potassium 3.2 (*)    Chloride 89 (*)    Glucose, Bld 153 (*)    All other components within normal limits  CBC - Abnormal; Notable for the following components:   WBC 11.8 (*)    All other components within normal limits   ____________________________________________  EKG   ____________________________________________  RADIOLOGY   ____________________________________________   PROCEDURES  Procedure(s) performed: No  Procedures   Critical Care performed: No ____________________________________________   INITIAL IMPRESSION / ASSESSMENT AND PLAN / ED COURSE  Pertinent labs & imaging results that were available during my care of the patient were reviewed by me and considered in my medical decision making (see chart for details).  Patient overall well-appearing in no acute distress, vital signs reassuring.  Exam is overall unremarkable.  Lab work is not significantly changed from prior, urinalysis consistent with UTI.  Will treat with Macrobid, x10 days, follow with PCP.   Return if any worsening symptoms    ____________________________________________   FINAL CLINICAL IMPRESSION(S) / ED DIAGNOSES  Final diagnoses:  Lower urinary tract infectious disease        Note:  This document was prepared using Dragon voice recognition software and may include unintentional dictation errors.    Jene Every, MD 12/23/17 2111

## 2017-12-24 ENCOUNTER — Ambulatory Visit: Payer: Self-pay | Admitting: Urology

## 2018-01-07 ENCOUNTER — Ambulatory Visit: Payer: Self-pay | Admitting: Urology

## 2018-01-07 NOTE — Progress Notes (Deleted)
01/07/2018 5:50 AM   Shannon Rios 1940-07-24 [id number]  Referring provider: Mickey Farber, MD [address] [facility] [place], [place] [address]  No chief complaint on file.   HPI: Patient is a 77 -year-old Caucasian female who is referred to [place] by Dr. Mickey Farber for urinary incontinence.  Patient states that she has had urinary incontinence for ***.  Patient has incontinence with ***.   She is experiencing *** incontinent episodes during the day. She is experiencing *** incontinent episodes during the night.  Her incontinence volume is ***.   She is wearing *** pads/depends daily.    She is having associated urinary frequency, urgency, dysuria, nocturia, intermittency, hesitancy, straining to urinate and weak urinary stream.   ***  Patient denies any gross hematuria, dysuria or suprapubic/flank pain.  Patient denies any fevers, chills, nausea or vomiting.  Her PVR is **  She does/does not have a history of urinary tract infections, STI's or injury to the bladder. ***  She does/does not have a history of nephrolithiasis, GU surgery or GU trauma. ***  She is/is not sexually active.  She has/has not noted incontinence with sexual intercourse.  ***   She is post menopausal. ***  She is having fecal incontinence.  ***  She has/not had any recent imaging studies.  ***  She is drinking *** of water daily.   She is drinking *** caffeinated beverages daily.  She is drinking *** alcoholic beverages daily.   Failed Vesicare and oxybutynin.       PMH: Past Medical History:  Diagnosis Date  . Dementia (HCC)   . Diffuse cystic mastopathy   . GERD (gastroesophageal reflux disease)   . Heart murmur 2005  . Hypertension 2009  . 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 Personal history of tobacco use, presenting hazards to health   . Special screening for malignant neoplasms, colon     Surgical History: Past Surgical History:  Procedure Laterality Date  .  ABDOMINAL HYSTERECTOMY  1980   with salpingo oophorectomy  . BREAST BIOPSY Bilateral    neg  . BREAST SURGERY  2005   biopsy  . COLONOSCOPY  2010   Dr. Evette Cristal  . HERNIA REPAIR    . THYROIDECTOMY, PARTIAL  2005    Home Medications:  Allergies as of 01/07/2018   No Known Allergies     Medication List        Accurate as of 01/07/18  5:50 AM. Always use your most recent med list.          benazepril 40 MG tablet Commonly known as:  LOTENSIN Take 1 tablet by mouth daily.   Glucosamine Sulfate 1000 MG Caps Take 1 capsule by mouth 2 (two) times daily.   hydrochlorothiazide 25 MG tablet Commonly known as:  HYDRODIURIL Take 1 tablet by mouth daily.   multivitamin tablet Take 1 tablet by mouth daily.   Vitamin D-3 1000 units Caps Take 1 capsule by mouth daily.   vitamin E 1000 UNIT capsule Take 1,000 Units by mouth as needed.       Allergies: No Known Allergies  Family History: No family history on file.  Social History:  reports that she has quit smoking. Her smoking use included cigarettes. She has a 5.00 pack-year smoking history. She has never used smokeless tobacco. She reports that she drinks alcohol. She reports that she does not use drugs.  ROS:  Physical Exam: There were no vitals taken for this visit.  Constitutional: Well nourished. Alert and oriented, No acute distress. HEENT: [place] AT, moist mucus membranes. Trachea midline, no masses. Cardiovascular: No clubbing, cyanosis, or edema. Respiratory: Normal respiratory effort, no increased work of breathing. GI: Abdomen is soft, non tender, non distended, no abdominal masses. Liver and spleen not palpable.  No hernias appreciated.  Stool sample for occult testing is not indicated.   GU: No CVA tenderness.  No bladder fullness or masses.  Normal external genitalia, normal pubic hair distribution, no lesions.  Normal urethral meatus, no lesions,  no prolapse, no discharge.   No urethral masses, tenderness and/or tenderness. No bladder fullness, tenderness or masses. Normal vagina mucosa, good estrogen effect, no discharge, no lesions, good pelvic support, no cystocele or rectocele noted.  Cervix, uterus and adnexa are surgically absent.  Anus and perineum are without rashes or lesions.   *** Skin: No rashes, bruises or suspicious lesions. Lymph: No cervical or inguinal adenopathy. Neurologic: Grossly intact, no focal deficits, moving all 4 extremities. Psychiatric: Normal mood and affect.  Laboratory Data: Lab Results  Component Value Date   WBC 11.8 (H) 12/23/2017   HGB 12.6 12/23/2017   HCT 37.1 12/23/2017   MCV 91.8 12/23/2017   PLT 350 12/23/2017    Lab Results  Component Value Date   CREATININE 0.90 12/23/2017    No results found for: PSA  No results found for: TESTOSTERONE  No results found for: HGBA1C  No results found for: TSH  No results found for: CHOL, HDL, CHOLHDL, VLDL, LDLCALC  No results found for: AST No results found for: ALT No components found for: ALKALINEPHOPHATASE No components found for: BILIRUBINTOTAL  No results found for: ESTRADIOL  Urinalysis    Component Value Date/Time   COLORURINE AMBER (A) 12/23/2017 1845   APPEARANCEUR CLOUDY (A) 12/23/2017 1845   LABSPEC 1.020 12/23/2017 1845   PHURINE 5.0 12/23/2017 1845   GLUCOSEU NEGATIVE 12/23/2017 1845   HGBUR LARGE (A) 12/23/2017 1845   BILIRUBINUR NEGATIVE 12/23/2017 1845   KETONESUR 5 (A) 12/23/2017 1845   PROTEINUR 100 (A) 12/23/2017 1845   NITRITE NEGATIVE 12/23/2017 1845   LEUKOCYTESUR MODERATE (A) 12/23/2017 1845    I have reviewed the labs.   Pertinent Imaging: ***   Assessment & Plan:  ***  1. Incontinence Discussed behavioral therapies, bladder training and bladder control strategies  - pelvic floor muscle training  - fluid management   - offered medical therapy with anticholinergic therapy or beta-3 adrenergic  receptor agonist and the potential side effects of each therapy ***  - offered refer to gynecology for a pessary fitting ***  - offered an appointment with one of our surgeon for a possible pelvic sling procedure ***  - would like to try the beta-3 adrenergic receptor agonist (Myrbetriq).  Given Myrbetriq 25 mg samples, #28.  I have reviewed with the patient of the side effects of Myrbetriq, such as: elevation in BP, urinary retention and/or HA.  She will return in one month for PVR and symptom recheck.  ***  - RTC in 3 weeks for PVR and symptom recheck ***   2. Fecal incontinence Being evaluated by GI Scheduled for colonoscopy in 01/2018   No follow-ups on file.  These notes generated with voice recognition software. I apologize for typographical errors.  Michiel Cowboy, PA-C  [facility] [address], [address] [place], [place] [address] [phone]

## 2018-01-10 ENCOUNTER — Emergency Department: Payer: Medicare Other

## 2018-01-10 ENCOUNTER — Inpatient Hospital Stay: Payer: Medicare Other

## 2018-01-10 ENCOUNTER — Inpatient Hospital Stay
Admission: EM | Admit: 2018-01-10 | Discharge: 2018-01-12 | DRG: 683 | Disposition: A | Discharge status: Home Health | Payer: Medicare Other | Attending: Internal Medicine | Admitting: Internal Medicine

## 2018-01-10 ENCOUNTER — Other Ambulatory Visit: Payer: Self-pay

## 2018-01-10 DIAGNOSIS — H11432 Conjunctival hyperemia, left eye: Secondary | ICD-10-CM | POA: Diagnosis not present

## 2018-01-10 DIAGNOSIS — N390 Urinary tract infection, site not specified: Secondary | ICD-10-CM | POA: Diagnosis present

## 2018-01-10 DIAGNOSIS — N179 Acute kidney failure, unspecified: Principal | ICD-10-CM | POA: Diagnosis present

## 2018-01-10 DIAGNOSIS — I10 Essential (primary) hypertension: Secondary | ICD-10-CM | POA: Diagnosis not present

## 2018-01-10 DIAGNOSIS — Z8249 Family history of ischemic heart disease and other diseases of the circulatory system: Secondary | ICD-10-CM

## 2018-01-10 DIAGNOSIS — Z90710 Acquired absence of both cervix and uterus: Secondary | ICD-10-CM

## 2018-01-10 DIAGNOSIS — M170 Bilateral primary osteoarthritis of knee: Secondary | ICD-10-CM | POA: Diagnosis not present

## 2018-01-10 DIAGNOSIS — K219 Gastro-esophageal reflux disease without esophagitis: Secondary | ICD-10-CM | POA: Diagnosis not present

## 2018-01-10 DIAGNOSIS — E559 Vitamin D deficiency, unspecified: Secondary | ICD-10-CM | POA: Diagnosis not present

## 2018-01-10 DIAGNOSIS — Z833 Family history of diabetes mellitus: Secondary | ICD-10-CM

## 2018-01-10 DIAGNOSIS — Z66 Do not resuscitate: Secondary | ICD-10-CM | POA: Diagnosis not present

## 2018-01-10 DIAGNOSIS — Z820 Family history of epilepsy and other diseases of the nervous system: Secondary | ICD-10-CM

## 2018-01-10 DIAGNOSIS — Z9181 History of falling: Secondary | ICD-10-CM

## 2018-01-10 DIAGNOSIS — Z87440 Personal history of urinary (tract) infections: Secondary | ICD-10-CM

## 2018-01-10 DIAGNOSIS — R7301 Impaired fasting glucose: Secondary | ICD-10-CM | POA: Diagnosis present

## 2018-01-10 DIAGNOSIS — Z87891 Personal history of nicotine dependence: Secondary | ICD-10-CM

## 2018-01-10 DIAGNOSIS — F0390 Unspecified dementia without behavioral disturbance: Secondary | ICD-10-CM | POA: Diagnosis not present

## 2018-01-10 DIAGNOSIS — R52 Pain, unspecified: Secondary | ICD-10-CM

## 2018-01-10 DIAGNOSIS — M5030 Other cervical disc degeneration, unspecified cervical region: Secondary | ICD-10-CM | POA: Diagnosis present

## 2018-01-10 DIAGNOSIS — E872 Acidosis: Secondary | ICD-10-CM | POA: Diagnosis not present

## 2018-01-10 DIAGNOSIS — Z90722 Acquired absence of ovaries, bilateral: Secondary | ICD-10-CM

## 2018-01-10 DIAGNOSIS — E876 Hypokalemia: Secondary | ICD-10-CM | POA: Diagnosis present

## 2018-01-10 DIAGNOSIS — M8580 Other specified disorders of bone density and structure, unspecified site: Secondary | ICD-10-CM | POA: Diagnosis present

## 2018-01-10 DIAGNOSIS — R531 Weakness: Secondary | ICD-10-CM | POA: Diagnosis present

## 2018-01-10 DIAGNOSIS — E860 Dehydration: Secondary | ICD-10-CM | POA: Diagnosis present

## 2018-01-10 DIAGNOSIS — Z9089 Acquired absence of other organs: Secondary | ICD-10-CM

## 2018-01-10 DIAGNOSIS — M25512 Pain in left shoulder: Secondary | ICD-10-CM | POA: Diagnosis present

## 2018-01-10 DIAGNOSIS — Z79899 Other long term (current) drug therapy: Secondary | ICD-10-CM

## 2018-01-10 LAB — COMPREHENSIVE METABOLIC PANEL
ALT: 20 U/L (ref 0–44)
ANION GAP: 14 (ref 5–15)
AST: 18 U/L (ref 15–41)
Albumin: 4.1 g/dL (ref 3.5–5.0)
Alkaline Phosphatase: 74 U/L (ref 38–126)
BILIRUBIN TOTAL: 0.5 mg/dL (ref 0.3–1.2)
BUN: 24 mg/dL — AB (ref 8–23)
CHLORIDE: 86 mmol/L — AB (ref 98–111)
CO2: 33 mmol/L — ABNORMAL HIGH (ref 22–32)
Calcium: 9.8 mg/dL (ref 8.9–10.3)
Creatinine, Ser: 2.15 mg/dL — ABNORMAL HIGH (ref 0.44–1.00)
GFR calc Af Amer: 25 mL/min — ABNORMAL LOW (ref >60)
GFR calc non Af Amer: 21 mL/min — ABNORMAL LOW (ref >60)
Glucose, Bld: 142 mg/dL — ABNORMAL HIGH (ref 70–99)
POTASSIUM: 3 mmol/L — AB (ref 3.5–5.1)
Sodium: 133 mmol/L — ABNORMAL LOW (ref 135–145)
TOTAL PROTEIN: 7.9 g/dL (ref 6.5–8.1)

## 2018-01-10 LAB — LACTIC ACID, PLASMA
LACTIC ACID, VENOUS: 2.7 mmol/L — AB (ref 0.5–1.9)
LACTIC ACID, VENOUS: 1.6 mmol/L (ref 0.5–1.9)

## 2018-01-10 LAB — URINALYSIS, COMPLETE (UACMP) WITH MICROSCOPIC
BACTERIA UA: NONE SEEN
Bilirubin Urine: NEGATIVE
Glucose, UA: NEGATIVE mg/dL
Ketones, ur: NEGATIVE mg/dL
LEUKOCYTES UA: NEGATIVE
Nitrite: NEGATIVE
PROTEIN: 100 mg/dL — AB
Specific Gravity, Urine: 1.019 (ref 1.005–1.030)
pH: 5 (ref 5.0–8.0)

## 2018-01-10 LAB — CBC WITH DIFFERENTIAL/PLATELET
ABS IMMATURE GRANULOCYTES: 0.05 10*3/uL (ref 0.00–0.07)
BASOS ABS: 0.1 10*3/uL (ref 0.0–0.1)
BASOS PCT: 1 %
Eosinophils Absolute: 0.1 10*3/uL (ref 0.0–0.5)
Eosinophils Relative: 1 %
HEMATOCRIT: 40.5 % (ref 36.0–46.0)
Hemoglobin: 13.1 g/dL (ref 12.0–15.0)
IMMATURE GRANULOCYTES: 1 %
LYMPHS PCT: 16 %
Lymphs Abs: 1.4 10*3/uL (ref 0.7–4.0)
MCH: 30.5 pg (ref 26.0–34.0)
MCHC: 32.3 g/dL (ref 30.0–36.0)
MCV: 94.2 fL (ref 80.0–100.0)
Monocytes Absolute: 0.9 10*3/uL (ref 0.1–1.0)
Monocytes Relative: 9 %
NEUTROS ABS: 6.7 10*3/uL (ref 1.7–7.7)
NEUTROS PCT: 72 %
Platelets: 516 10*3/uL — ABNORMAL HIGH (ref 150–400)
RBC: 4.3 MIL/uL (ref 3.87–5.11)
RDW: 13.1 % (ref 11.5–15.5)
WBC: 9.1 10*3/uL (ref 4.0–10.5)
nRBC: 0 % (ref 0.0–0.2)

## 2018-01-10 LAB — URIC ACID: URIC ACID, SERUM: 8 mg/dL — AB (ref 2.5–7.1)

## 2018-01-10 LAB — HEMOGLOBIN A1C
Hgb A1c MFr Bld: 5.8 % — ABNORMAL HIGH (ref 4.8–5.6)
Mean Plasma Glucose: 119.76 mg/dL

## 2018-01-10 MED ORDER — GLUCOSAMINE SULFATE 1000 MG PO CAPS: 1.0000 | ORAL_CAPSULE | Freq: Two times a day (BID) | ORAL | Status: DC

## 2018-01-10 MED ORDER — MAGNESIUM SULFATE 2 GM/50ML IV SOLN
2.0000 g | Freq: Once | INTRAVENOUS | Status: AC
  Administered 2018-01-10: 2 g via INTRAVENOUS
  Filled 2018-01-10: qty 50

## 2018-01-10 MED ORDER — SODIUM CHLORIDE 0.9 % IV BOLUS
1000.0000 mL | Freq: Once | INTRAVENOUS | Status: AC
  Administered 2018-01-10: 1000 mL via INTRAVENOUS

## 2018-01-10 MED ORDER — POTASSIUM CHLORIDE CRYS ER 20 MEQ PO TBCR
40.0000 meq | EXTENDED_RELEASE_TABLET | Freq: Once | ORAL | Status: AC
  Administered 2018-01-10: 40 meq via ORAL
  Filled 2018-01-10: qty 2

## 2018-01-10 MED ORDER — ACETAMINOPHEN 325 MG PO TABS: 650.0000 mg | ORAL_TABLET | Freq: Four times a day (QID) | ORAL | Status: DC | PRN

## 2018-01-10 MED ORDER — VITAMIN D 1000 UNITS PO TABS
1000.0000 [IU] | ORAL_TABLET | Freq: Every day | ORAL | Status: DC
  Filled 2018-01-10: qty 1

## 2018-01-10 MED ORDER — ACETAMINOPHEN 650 MG RE SUPP: 650.0000 mg | Freq: Four times a day (QID) | RECTAL | Status: DC | PRN

## 2018-01-10 MED ORDER — VITAMIN E 45 MG (100 UNIT) PO CAPS
1000.0000 [IU] | ORAL_CAPSULE | Freq: Every day | ORAL | Status: DC
  Filled 2018-01-10 (×2): qty 2

## 2018-01-10 MED ORDER — SODIUM CHLORIDE 0.9 % IV SOLN
INTRAVENOUS | Status: DC
  Administered 2018-01-10: 22:00:00 via INTRAVENOUS

## 2018-01-10 MED ORDER — SODIUM CHLORIDE 0.9 % IV SOLN
1.0000 g | INTRAVENOUS | Status: DC
  Administered 2018-01-10 – 2018-01-11 (×2): 1 g via INTRAVENOUS
  Filled 2018-01-10: qty 10
  Filled 2018-01-10 (×2): qty 1

## 2018-01-10 MED ORDER — HEPARIN SODIUM (PORCINE) 5000 UNIT/ML IJ SOLN
5000.0000 [IU] | Freq: Three times a day (TID) | INTRAMUSCULAR | Status: DC
  Administered 2018-01-10 – 2018-01-12 (×4): 5000 [IU] via SUBCUTANEOUS
  Filled 2018-01-10 (×4): qty 1

## 2018-01-10 MED ORDER — ADULT MULTIVITAMIN W/MINERALS CH
1.0000 | ORAL_TABLET | Freq: Every day | ORAL | Status: DC
  Filled 2018-01-10: qty 1

## 2018-01-10 NOTE — H&P (Signed)
[facility] [place] [facility]   PATIENT NAME: Shannon Rios    MR#:  [id number]  DATE OF BIRTH:  Nov 18, 1940  DATE OF ADMISSION:  01/10/2018  PRIMARY CARE PHYSICIAN: Mickey Farber, MD   REQUESTING/REFERRING PHYSICIAN: Dr Tery Sanfilippo  CHIEF COMPLAINT:   Chief Complaint  Patient presents with  . Weakness    HISTORY OF PRESENT ILLNESS:  Shannon Rios  is a 77 y.o. female with a known history of dementia.  As per the husband she has been declining over the past for 5 weeks.  She has been having pain all over.  Her left shoulder hurts.  She has had 2 UTIs and finished up antibiotics last Wednesday.  She is had 2 falls and bruise on the buttock.  Has had progressive knee issues and she was evaluated in the past for knee replacements.  She is been having some shaking episodes.  She has been having headache and blinking a lot.  A lot of urinary frequency.  The patient is able to follow some simple commands but unable to communicate very well.  Most of the history from the husband at the bedside.  PAST MEDICAL HISTORY:   Past Medical History:  Diagnosis Date  . Dementia (HCC)   . Diffuse cystic mastopathy   . GERD (gastroesophageal reflux disease)   . Heart murmur 2005  . Hypertension 2009  . 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 Personal history of tobacco use, presenting hazards to health   . Special screening for malignant neoplasms, colon     PAST SURGICAL HISTORY:   Past Surgical History:  Procedure Laterality Date  . ABDOMINAL HYSTERECTOMY  1980   with salpingo oophorectomy  . BREAST BIOPSY Bilateral    neg  . BREAST SURGERY  2005   biopsy  . COLONOSCOPY  2010   Dr. Evette Cristal  . HERNIA REPAIR    . THYROIDECTOMY, PARTIAL  2005    SOCIAL HISTORY:   Social History   Tobacco Use  . Smoking status: Former Smoker    Packs/day: 0.50    Years: 10.00    Pack years: 5.00    Types: Cigarettes  . Smokeless tobacco: Never Used  Substance Use Topics   . Alcohol use: Not Currently    Comment: socially    FAMILY HISTORY:   Family History  Problem Relation Age of Onset  . Diabetes Mother   . Alzheimer's disease Mother   . CAD Father     DRUG ALLERGIES:  No Known Allergies  REVIEW OF SYSTEMS:  Unable to provide review of systems secondary to dementia  MEDICATIONS AT HOME:   Prior to Admission medications   Medication Sig Start Date End Date Taking? Authorizing Provider  benazepril (LOTENSIN) 40 MG tablet Take 1 tablet by mouth daily. 10/24/12   [provider]  Cholecalciferol (VITAMIN D-3) 1000 UNITS CAPS Take 1 capsule by mouth daily.    [provider]  Glucosamine Sulfate 1000 MG CAPS Take 1 capsule by mouth 2 (two) times daily.    [provider]  hydrochlorothiazide (HYDRODIURIL) 25 MG tablet Take 1 tablet by mouth daily. 11/16/12   [provider]  Multiple Vitamin (MULTIVITAMIN) tablet Take 1 tablet by mouth daily.    [provider]  vitamin E 1000 UNIT capsule Take 1,000 Units by mouth as needed.    [provider]      VITAL SIGNS:  Blood pressure (!) 156/78, pulse 76, temperature 97.7 F (36.5 C),  temperature source Oral, resp. rate 18, height 5\' 6"  (1.676 m), weight 80.3 kg, SpO2 100 %.  PHYSICAL EXAMINATION:  GENERAL:  77 y.o.-year-old patient lying in the bed with no acute distress.  EYES: Pupils equal, round, reactive to light and accommodation. No scleral icterus. Extraocular muscles intact.  HEENT: Head atraumatic, normocephalic. Oropharynx and nasopharynx clear.  NECK:  Supple, no jugular venous distention. No thyroid enlargement, no tenderness.  LUNGS: Normal breath sounds bilaterally, no wheezing, rales,rhonchi or crepitation. No use of accessory muscles of respiration.  CARDIOVASCULAR: S1, S2 normal. No murmurs, rubs, or gallops.  ABDOMEN: Soft, nontender, nondistended. Bowel sounds present. No organomegaly or mass.  EXTREMITIES: 2+ pedal edema, no  cyanosis, or clubbing.  NEUROLOGIC: Cranial nerves II through XII are intact. Muscle strength 4/5 in all extremities.  PSYCHIATRIC: The patient is alert and follows some simple commands.  SKIN: Slight rash on the back  LABORATORY PANEL:   CBC Recent Labs  Lab 01/10/18 1604  WBC 9.1  HGB 13.1  HCT 40.5  PLT 516*   ------------------------------------------------------------------------------------------------------------------  Chemistries  Recent Labs  Lab 01/10/18 1604  NA 133*  K 3.0*  CL 86*  CO2 33*  GLUCOSE 142*  BUN 24*  CREATININE 2.15*  CALCIUM 9.8  AST 18  ALT 20  ALKPHOS 74  BILITOT 0.5   ------------------------------------------------------------------------------------------------------------------   RADIOLOGY:  Dg Chest 2 View  Result Date: 01/10/2018 CLINICAL DATA:  Weakness and falls. EXAM: CHEST - 2 VIEW COMPARISON:  CT scan September 07, 2017 FINDINGS: The heart size and mediastinal contours are within normal limits. Both lungs are clear. The visualized skeletal structures are unremarkable. IMPRESSION: No active cardiopulmonary disease. Electronically Signed   By: Gerome Sam III M.D   On: 01/10/2018 16:33    EKG:   Normal sinus rhythm 86 bpm, right superior axis deviation  IMPRESSION AND PLAN:   1.  Acute kidney injury and dehydration.  IV fluid hydration. 2.  Progressive dementia.  Patient made a DO NOT RESUSCITATE.  Palliative care consultation.  Husband would like to take the patient home.  Physical therapy evaluation.   3.  Knee pain and shoulder pain.  Would like to give anti-inflammatory but unable to do with acute kidney injury.  Tylenol as needed.  Hopefully can do a low-dose anti-inflammatory once kidney function is better.  Will get a shoulder x-ray.  Send off her uric acid 4.  Possible UTI.  Send off urine culture and empiric Rocephin for right now 5.  Impaired fasting glucose add on hemoglobin A1c 6.  Lactic acidosis 7.   Hypokalemia oral potassium will also give magnesium and check magnesium tomorrow morning.  All the records are reviewed and case discussed with ED provider. Management plans discussed with the patient, family and they are in agreement.  CODE STATUS: DNR  TOTAL TIME TAKING CARE OF THIS PATIENT: 50 minutes.    Alford Highland M.D on 01/10/2018 at 7:38 PM  Between 7am to 6pm - Pager - [phone]  After 6pm call admission pager [phone]  [facility] Office  [phone]  CC: Primary care physician; Mickey Farber, MD

## 2018-01-10 NOTE — Progress Notes (Signed)
Patient ID: Shannon Rios, female   DOB: September 08, 1940, 77 y.o.   MRN: [id number]  ACP note  Patient unable to participate in conversation secondary to dementia Husband at the bedside  Diagnosis: Acute kidney injury and dehydration, progressive dementia, knee pain and shoulder pain, impaired fasting glucose, lactic acidosis, hypokalemia.  CODE STATUS discussed and patient was made a DNR.  Plan.  Hydration and physical therapy evaluation.  Palliative care consultation.  Goal for the husband is to take the patient home and take care of her as long as possible.  Time spent on ACP discussion 17 minutes Dr. Alford Highland

## 2018-01-10 NOTE — Progress Notes (Addendum)
PHARMACIST - PHYSICIAN ORDER COMMUNICATION  CONCERNING: P&T Medication Policy on Herbal Medications  DESCRIPTION:  This patient's order for: Glucosamine Sulfate has been noted.  This product(s) is classified as an "herbal" or natural product. Due to a lack of definitive safety studies or FDA approval, nonstandard manufacturing practices, plus the potential risk of unknown drug-drug interactions while on inpatient medications, the Pharmacy and Therapeutics Committee does not permit the use of "herbal" or natural products of this type within [facility].   ACTION TAKEN: The pharmacy department is unable to verify this order at this time . Please reevaluate patient's clinical condition at discharge and address if the herbal or natural product(s) should be resumed at that time.  Gardner Candle, PharmD, BCPS Clinical Pharmacist 01/10/2018 8:42 PM

## 2018-01-10 NOTE — ED Notes (Signed)
Patient transported to room 224 by this EDT and Allayne Stack, EDT.

## 2018-01-10 NOTE — ED Notes (Signed)
Dysuria

## 2018-01-10 NOTE — ED Notes (Signed)
Patient transported to X-ray 

## 2018-01-10 NOTE — ED Triage Notes (Signed)
Pt sent from [facility] for increased weakness and falls. Treated twice recently for UTI. Finished antibiotics. Hx dementia at baseline. Family member with pt. Family member reports that "it started stinging when she urinates earlier this week."

## 2018-01-10 NOTE — ED Provider Notes (Signed)
[facility] Emergency Department Provider Note   ____________________________________________   I have reviewed the triage vital signs and the nursing notes.   HISTORY  Chief Complaint Weakness   History limited by and level 5 caveat due to dementia, history obtained from husband.   HPI Shannon Rios is a 77 y.o. female who presents to the emergency department today because of concerns for weakness and possible urinary tract infection.  Husband states that she has had multiple urinary tract infections recently.  The patient has been increasingly weak and has had a couple of falls.  Husband also describes a syncopal episode.  The husband has noticed some bad odor to her urine.  She has had increased weakness.  Per medical record review patient has a history of dementia.   Past Medical History:  Diagnosis Date  . Dementia (HCC)   . Diffuse cystic mastopathy   . GERD (gastroesophageal reflux disease)   . Heart murmur 2005  . Hypertension 2009  . 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 Personal history of tobacco use, presenting hazards to health   . Special screening for malignant neoplasms, colon     Patient Active Problem List   Diagnosis Date Noted  . Urinary incontinence 09/20/2017  . Lymphedema of both lower extremities 07/24/2017  . Mood disorder (HCC) 09/25/2016  . Primary osteoarthritis of left knee 09/08/2016  . Primary osteoarthritis of right knee 09/08/2016  . Primary osteoarthritis of both knees 07/19/2016  . Moderate dementia, with behavioral disturbance (HCC) 06/04/2016  . DDD (degenerative disc disease), cervical 01/18/2016  . Vitamin D deficiency 07/15/2014  . Allergic rhinitis 01/12/2014  . Osteopenia 01/12/2014  . Hyperlipidemia 01/07/2014  . Impaired glucose tolerance 01/07/2014  . Hypertension 09/01/2013  . Diffuse cystic mastopathy 12/23/2012    Past Surgical History:  Procedure Laterality Date  . ABDOMINAL HYSTERECTOMY   1980   with salpingo oophorectomy  . BREAST BIOPSY Bilateral    neg  . BREAST SURGERY  2005   biopsy  . COLONOSCOPY  2010   Dr. Evette Cristal  . HERNIA REPAIR    . THYROIDECTOMY, PARTIAL  2005    Prior to Admission medications   Medication Sig Start Date End Date Taking? Authorizing Provider  benazepril (LOTENSIN) 40 MG tablet Take 1 tablet by mouth daily. 10/24/12   [provider]  Cholecalciferol (VITAMIN D-3) 1000 UNITS CAPS Take 1 capsule by mouth daily.    [provider]  Glucosamine Sulfate 1000 MG CAPS Take 1 capsule by mouth 2 (two) times daily.    [provider]  hydrochlorothiazide (HYDRODIURIL) 25 MG tablet Take 1 tablet by mouth daily. 11/16/12   [provider]  Multiple Vitamin (MULTIVITAMIN) tablet Take 1 tablet by mouth daily.    [provider]  vitamin E 1000 UNIT capsule Take 1,000 Units by mouth as needed.    [provider]    Allergies Patient has no known allergies.  History reviewed. No pertinent family history.  Social History Social History   Tobacco Use  . Smoking status: Former Smoker    Packs/day: 0.50    Years: 10.00    Pack years: 5.00    Types: Cigarettes  . Smokeless tobacco: Never Used  Substance Use Topics  . Alcohol use: Yes    Comment: socially  . Drug use: No    Review of Systems Unable to obtain secondary to dementia ____________________________________________   PHYSICAL EXAM:  VITAL SIGNS: ED Triage Vitals  Enc Vitals  Group     BP 01/10/18 1605 (!) 101/55     Pulse Rate 01/10/18 1605 86     Resp 01/10/18 1605 18     Temp 01/10/18 1605 97.7 F (36.5 C)     Temp Source 01/10/18 1605 Oral     SpO2 01/10/18 1605 98 %     Weight 01/10/18 1602 177 lb (80.3 kg)     Height 01/10/18 1602 5\' 6"  (1.676 m)   Constitutional: Awake and alert, not oriented Eyes: Conjunctivae are normal.  ENT      Head: Normocephalic and atraumatic.      Nose: No congestion/rhinnorhea.       Mouth/Throat: Mucous membranes are moist.      Neck: No stridor. Hematological/Lymphatic/Immunilogical: No cervical lymphadenopathy. Cardiovascular: Normal rate, regular rhythm.  No murmurs, rubs, or gallops. Respiratory: Normal respiratory effort without tachypnea nor retractions. Breath sounds are clear and equal bilaterally. No wheezes/rales/rhonchi. Gastrointestinal: Soft and non tender. No rebound. No guarding.  Genitourinary: Deferred Musculoskeletal: Normal range of motion in all extremities. No lower extremity edema. Neurologic:  Dementia. No gross focal neurologic deficits are appreciated.  Skin:  Skin is warm, dry and intact. No rash noted.  ____________________________________________    LABS (pertinent positives/negatives)  CBC wbc 9.1, hgb 13.1, plt 516 CMP na 133, k 3.0, glu 142, cr 2.15, ca 9.8 Lactic 2.7  ____________________________________________   EKG  I, Phineas Semen, attending physician, personally viewed and interpreted this EKG  EKG Time: 1607 Rate: 86 Rhythm: normal sinus rhythm Axis: right superior axis deviation Intervals: qtc 459 QRS: narrow ST changes: no st elevation Impression: abnormal ekg  ____________________________________________    RADIOLOGY  CXR No acute disease  ____________________________________________   PROCEDURES  Procedures  ____________________________________________   INITIAL IMPRESSION / ASSESSMENT AND PLAN / ED COURSE  Pertinent labs & imaging results that were available during my care of the patient were reviewed by me and considered in my medical decision making (see chart for details).   Patient presented to the emergency department today because of concerns for weakness possible urinary tract infection.  Blood work is concerning for elevated creatinine.  This would be consistent with acute kidney injury I think at this point likely secondary to dehydration.  Will start IV fluids.  Will plan on  admission.  ____________________________________________   FINAL CLINICAL IMPRESSION(S) / ED DIAGNOSES  Final diagnoses:  AKI (acute kidney injury) (HCC)  Dehydration     Note: This dictation was prepared with Dragon dictation. Any transcriptional errors that result from this process are unintentional     Phineas Semen, MD 01/10/18 2337

## 2018-01-11 DIAGNOSIS — M25512 Pain in left shoulder: Secondary | ICD-10-CM | POA: Diagnosis not present

## 2018-01-11 DIAGNOSIS — N390 Urinary tract infection, site not specified: Secondary | ICD-10-CM | POA: Diagnosis not present

## 2018-01-11 DIAGNOSIS — K219 Gastro-esophageal reflux disease without esophagitis: Secondary | ICD-10-CM | POA: Diagnosis not present

## 2018-01-11 DIAGNOSIS — E860 Dehydration: Secondary | ICD-10-CM | POA: Diagnosis not present

## 2018-01-11 DIAGNOSIS — R7301 Impaired fasting glucose: Secondary | ICD-10-CM | POA: Diagnosis not present

## 2018-01-11 DIAGNOSIS — I10 Essential (primary) hypertension: Secondary | ICD-10-CM | POA: Diagnosis not present

## 2018-01-11 DIAGNOSIS — Z9181 History of falling: Secondary | ICD-10-CM | POA: Diagnosis not present

## 2018-01-11 DIAGNOSIS — E872 Acidosis: Secondary | ICD-10-CM | POA: Diagnosis not present

## 2018-01-11 DIAGNOSIS — N179 Acute kidney failure, unspecified: Secondary | ICD-10-CM | POA: Diagnosis not present

## 2018-01-11 DIAGNOSIS — Z66 Do not resuscitate: Secondary | ICD-10-CM | POA: Diagnosis not present

## 2018-01-11 DIAGNOSIS — M8580 Other specified disorders of bone density and structure, unspecified site: Secondary | ICD-10-CM | POA: Diagnosis not present

## 2018-01-11 DIAGNOSIS — R531 Weakness: Secondary | ICD-10-CM | POA: Diagnosis present

## 2018-01-11 DIAGNOSIS — H11432 Conjunctival hyperemia, left eye: Secondary | ICD-10-CM | POA: Diagnosis not present

## 2018-01-11 DIAGNOSIS — E559 Vitamin D deficiency, unspecified: Secondary | ICD-10-CM | POA: Diagnosis not present

## 2018-01-11 DIAGNOSIS — M5030 Other cervical disc degeneration, unspecified cervical region: Secondary | ICD-10-CM | POA: Diagnosis not present

## 2018-01-11 DIAGNOSIS — Z87440 Personal history of urinary (tract) infections: Secondary | ICD-10-CM | POA: Diagnosis not present

## 2018-01-11 DIAGNOSIS — M170 Bilateral primary osteoarthritis of knee: Secondary | ICD-10-CM | POA: Diagnosis not present

## 2018-01-11 DIAGNOSIS — F0390 Unspecified dementia without behavioral disturbance: Secondary | ICD-10-CM | POA: Diagnosis not present

## 2018-01-11 DIAGNOSIS — E876 Hypokalemia: Secondary | ICD-10-CM | POA: Diagnosis not present

## 2018-01-11 LAB — BASIC METABOLIC PANEL
Anion gap: 11 (ref 5–15)
BUN: 22 mg/dL (ref 8–23)
CHLORIDE: 94 mmol/L — AB (ref 98–111)
CO2: 33 mmol/L — ABNORMAL HIGH (ref 22–32)
Calcium: 9.1 mg/dL (ref 8.9–10.3)
Creatinine, Ser: 1.17 mg/dL — ABNORMAL HIGH (ref 0.44–1.00)
GFR calc Af Amer: 51 mL/min — ABNORMAL LOW (ref >60)
GFR calc non Af Amer: 44 mL/min — ABNORMAL LOW (ref >60)
GLUCOSE: 117 mg/dL — AB (ref 70–99)
POTASSIUM: 3.1 mmol/L — AB (ref 3.5–5.1)
Sodium: 138 mmol/L (ref 135–145)

## 2018-01-11 LAB — CBC
HEMATOCRIT: 37.3 % (ref 36.0–46.0)
HEMOGLOBIN: 12.2 g/dL (ref 12.0–15.0)
MCH: 30.2 pg (ref 26.0–34.0)
MCHC: 32.7 g/dL (ref 30.0–36.0)
MCV: 92.3 fL (ref 80.0–100.0)
Platelets: 449 10*3/uL — ABNORMAL HIGH (ref 150–400)
RBC: 4.04 MIL/uL (ref 3.87–5.11)
RDW: 13 % (ref 11.5–15.5)
WBC: 8.5 10*3/uL (ref 4.0–10.5)
nRBC: 0 % (ref 0.0–0.2)

## 2018-01-11 LAB — MAGNESIUM: MAGNESIUM: 3 mg/dL — AB (ref 1.7–2.4)

## 2018-01-11 MED ORDER — POTASSIUM CHLORIDE CRYS ER 20 MEQ PO TBCR
40.0000 meq | EXTENDED_RELEASE_TABLET | Freq: Three times a day (TID) | ORAL | Status: DC
  Administered 2018-01-11: 40 meq via ORAL
  Filled 2018-01-11: qty 2

## 2018-01-11 MED ORDER — CHOLECALCIFEROL NICU/PEDS ORAL SYRINGE 400 UNITS/ML (10 MCG/ML)
1000.0000 [IU] | Freq: Every day | ORAL | Status: DC
  Administered 2018-01-12: 1000 [IU] via ORAL
  Filled 2018-01-11 (×2): qty 2.5

## 2018-01-11 MED ORDER — PROMETHAZINE HCL 25 MG/ML IJ SOLN: 12.5000 mg | Freq: Three times a day (TID) | INTRAMUSCULAR | Status: DC | PRN

## 2018-01-11 MED ORDER — ADULT MULTIVITAMIN LIQUID CH
15.0000 mL | Freq: Every day | ORAL | Status: DC
  Filled 2018-01-11 (×2): qty 15

## 2018-01-11 NOTE — Progress Notes (Signed)
[facility] [place] [facility]   PATIENT NAME: Shannon Rios    MR#:  [id number]  DATE OF BIRTH:  [birth year]  SUBJECTIVE:  patient has dementia. She is pleasantly confused. Her sister in the room. She ate well. She is out sitting in the chair.  REVIEW OF SYSTEMS:   Review of Systems  Unable to perform ROS: Dementia   Tolerating Diet:yes Tolerating PT: yes some  DRUG ALLERGIES:  No Known Allergies  VITALS:  Blood pressure (!) 127/58, pulse 81, temperature 98 F (36.7 C), temperature source Oral, resp. rate 18, height 5\' 6"  (1.676 m), weight 76.9 kg, SpO2 97 %.  PHYSICAL EXAMINATION:   Physical Exam  GENERAL:  77 y.o.-year-old patient lying in the bed with no acute distress.  EYES: Pupils equal, round, reactive to light and accommodation. No scleral icterus. Extraocular muscles intact.  HEENT: Head atraumatic, normocephalic. Oropharynx and nasopharynx clear.  NECK:  Supple, no jugular venous distention. No thyroid enlargement, no tenderness.  LUNGS: Normal breath sounds bilaterally, no wheezing, rales, rhonchi. No use of accessory muscles of respiration.  CARDIOVASCULAR: S1, S2 normal. No murmurs, rubs, or gallops.  ABDOMEN: Soft, nontender, nondistended. Bowel sounds present. No organomegaly or mass.  EXTREMITIES: No cyanosis, clubbing or edema b/l.    NEUROLOGIC: Cranial nerves II through XII are intact. No focal Motor or sensory deficits b/l.   PSYCHIATRIC:  patient is alert and  pleasantly confused SKIN: No obvious rash, lesion, or ulcer.   LABORATORY PANEL:  CBC Recent Labs  Lab 01/11/18 0350  WBC 8.5  HGB 12.2  HCT 37.3  PLT 449*    Chemistries  Recent Labs  Lab 01/10/18 1604 01/11/18 0350  NA 133* 138  K 3.0* 3.1*  CL 86* 94*  CO2 33* 33*  GLUCOSE 142* 117*  BUN 24* 22  CREATININE 2.15* 1.17*  CALCIUM 9.8 9.1  MG  --  3.0*  AST 18  --   ALT 20  --   ALKPHOS 74  --   BILITOT 0.5  --    Cardiac Enzymes No results  for input(s): TROPONINI in the last 168 hours. RADIOLOGY:  Dg Chest 2 View  Result Date: 01/10/2018 CLINICAL DATA:  Weakness and falls. EXAM: CHEST - 2 VIEW COMPARISON:  CT scan September 07, 2017 FINDINGS: The heart size and mediastinal contours are within normal limits. Both lungs are clear. The visualized skeletal structures are unremarkable. IMPRESSION: No active cardiopulmonary disease. Electronically Signed   By: Gerome Sam III M.D   On: 01/10/2018 16:33   Dg Shoulder Left  Result Date: 01/10/2018 CLINICAL DATA:  Left shoulder pain.  No known injury. EXAM: LEFT SHOULDER - 2+ VIEW COMPARISON:  None. FINDINGS: There is no evidence of fracture or dislocation. There is no evidence of arthropathy or other focal bone abnormality. Soft tissues are unremarkable. IMPRESSION: No fracture, dislocation or arthropathy of the left shoulder. Electronically Signed   By: Deatra Robinson M.D.   On: 01/10/2018 20:33   ASSESSMENT AND PLAN:  Shannon Rios  is a 77 y.o. female with a known history of dementia.  As per the husband she has been declining over the past for 5 weeks.  She has been having pain all over.  Her left shoulder hurts.  She has had 2 UTIs and finished up antibiotics last Wednesday.  1.  Acute kidney injury and dehydration.  IV fluid hydration. -Creatinine improving. Patient tolerating PO as well. DC IV fluids.  2.  Progressive  dementia.  Patient made a DO NOT RESUSCITATE.  Palliative care consultation.  Husband would like to take the patient home.  Physical therapy evaluation.    3.  Knee pain and shoulder pain.  Would like to give anti-inflammatory but unable to do with acute kidney injury.  Tylenol as needed.  Hopefully can do a low-dose anti-inflammatory once kidney function is better.   shoulder x-ray did not show any abnormalities acute   4.  Possible UTI.   urine analysis much impressive. On IV Rocephin. If urine culture is negative will discontinue antibiotic since patient just  finished the course of antibiotic.  5.  Hypokalemia oral potassium will also give magnesium   Case discussed with Care Management/Social Worker. Management plans discussed with the patient, family and they are in agreement.  CODE STATUS: DNR  DVT Prophylaxis: lovnenox  TOTAL TIME TAKING CARE OF THIS PATIENT: *30* minutes.  >50% time spent on counselling and coordination of care  POSSIBLE D/C IN *1-2* DAYS, DEPENDING ON CLINICAL CONDITION.  Note: This dictation was prepared with Dragon dictation along with smaller phrase technology. Any transcriptional errors that result from this process are unintentional.  Enedina Finner M.D on 01/11/2018 at 3:41 PM  Between 7am to 6pm - Pager - [phone]  After 6pm go to [email or web address] - Social research officer, government  [facility]  Office  [phone]  CC: Primary care physician; Mickey Farber, MDPatient ID: Shannon Rios, female   DOB: [birth year], 77 y.o.   MRN: [id number] Mickey Farber, MDPatient ID: Shannon Rios, female   DOB: [birth year], 77 y.o.   MRN: [id number]

## 2018-01-11 NOTE — Evaluation (Signed)
Physical Therapy Evaluation Patient Details Name: Shannon Rios MRN: [id number] DOB: Jan 27, 1941 Today's Date: 01/11/2018   History of Present Illness  presented to ER secondary to progressive LE weakness, generalized pain and falls (x2); admitted with AKI secondary to dehydration  Clinical Impression  Patient alert and oriented to self only; follows simple commands, but often requires hand-over-hand to comprehend and initiate functional activities.  Bilat UE/LE strength and ROM grossly symmetrical and WFL for basic transfers and mobility.  Currently requiring mod assist +1 for bed mobility; mod/max assist +2 for sit/stand, basic transfers and very short distance gait (3', bed/chair) with bilat HHA.  Constant manual facilitation for postural control, weight shifting; hand-over-hand for task comprehension and initiation at times.  Frequently attempting spontaneous sit during transfer attempts.  Very high fall risk due to cognitive status and anxiety/fear of falling. Unsafe to attempt additional gait/mobility at this time. Would benefit from skilled PT to address above deficits and promote optimal return to PLOF; Recommend transition to HHPT, HHOT, HHRN, HHaide, CSW upon discharge from acute hospitalization.     Follow Up Recommendations Home health PT;Supervision/Assistance - 24 hour    Equipment Recommendations  (Optimal performance with bilat HHA currently; may benefit from use of hoyer lift for home environment)    Recommendations for Other Services (HHOT, HHRN, HH aide, CSW)     Precautions / Restrictions Precautions Precautions: Fall Restrictions Weight Bearing Restrictions: No      Mobility  Bed Mobility Overal bed mobility: Needs Assistance Bed Mobility: Supine to Sit     Supine to sit: Mod assist     General bed mobility comments: assist for LE management (to initiate/sequence movement) and to complete truncal elevation  Transfers Overall transfer level: Needs  assistance Equipment used: 2 person hand held assist Transfers: Sit to/from Stand Sit to Stand: Mod assist;+2 physical assistance         General transfer comment: manual facilitation for weight shift, lift off and standing balance. Excessive anxiety regarding fear of falling.  Ambulation/Gait Ambulation/Gait assistance: Mod assist;+2 physical assistance Gait Distance (Feet): 3 Feet Assistive device: 2 person hand held assist       General Gait Details: broad BOS, very shuffling stepping performance requiring max manual cuing for lateral weight shifting to unweight/advance LEs.  Constant attempts to spontaneously sit; poor awareness of environment and task at hand. Constant verbal cuing, hand-over-hand guidance to lead/driect all movement.  Unsafe for additional gait attempts at this time.  Stairs            Wheelchair Mobility    Modified Rankin (Stroke Patients Only)       Balance Overall balance assessment: Needs assistance Sitting-balance support: No upper extremity supported;Feet supported Sitting balance-Leahy Scale: Fair     Standing balance support: Bilateral upper extremity supported Standing balance-Leahy Scale: Poor                               Pertinent Vitals/Pain Pain Assessment: Faces Faces Pain Scale: Hurts little more Pain Location: generalized pain, bilat LEs/knees Pain Descriptors / Indicators: Aching;Grimacing;Guarding Pain Intervention(s): Limited activity within patient's tolerance;Monitored during session;Repositioned    Home Living Family/patient expects to be discharged to:: Private residence Living Arrangements: Spouse/significant other Available Help at Discharge: Family Type of Home: House Home Access: Stairs to enter Entrance Stairs-Rails: Doctor, general practice of Steps: 2 Home Layout: One level   Additional Comments: Per husband, in the process of obtaining a manual  WC and installing a ramp for  entry/exit of home    Prior Function Level of Independence: Needs assistance         Comments: Per husband, has been essentially bed-level for all activities for past 6-8 weeks (getting out of bed "on a good day, when she wants to"); requiring progressive increase in level of assist required to care for her (+2 for all OOB activities).     Hand Dominance        Extremity/Trunk Assessment   Upper Extremity Assessment Upper Extremity Assessment: Generalized weakness    Lower Extremity Assessment Lower Extremity Assessment: Generalized weakness(grossly at least 4-/5 througout; able to mobilize through functional range)       Communication   Communication: No difficulties  Cognition Arousal/Alertness: Awake/alert Behavior During Therapy: Anxious Overall Cognitive Status: History of cognitive impairments - at baseline                                 General Comments: very anxious, fearful of falling      General Comments      Exercises Other Exercises Other Exercises: Toilet transfer, SPT with bilat HHA +2, mod/max assist +2 for sit/stand and SPT.  Poor recall of task, poor balance; hand-over-hand required to comprehend/initiate all movement transitions. Dep of 3rd person for hygiene and clothing managemetn. Other Exercises: Sit/stand x2 from edge of bed, x1 from [facility], x1 from recliner, mod/max assist +2 for each transfer.   Assessment/Plan    PT Assessment Patient needs continued PT services  PT Problem List Decreased strength;Decreased range of motion;Decreased activity tolerance;Decreased balance;Decreased knowledge of use of DME;Decreased cognition;Decreased mobility;Decreased coordination;Decreased safety awareness;Decreased knowledge of precautions       PT Treatment Interventions DME instruction;Functional mobility training;Gait training;Therapeutic activities;Therapeutic exercise;Balance training;Cognitive remediation;Patient/family education    PT  Goals (Current goals can be found in the Care Plan section)  Acute Rehab PT Goals Patient Stated Goal: per husband, to keep her at home if possible PT Goal Formulation: With patient/family Time For Goal Achievement: 01/25/18 Potential to Achieve Goals: Fair    Frequency Min 2X/week   Barriers to discharge        Co-evaluation               AM-PAC PT "6 Clicks" Daily Activity  Outcome Measure Difficulty turning over in bed (including adjusting bedclothes, sheets and blankets)?: Unable Difficulty moving from lying on back to sitting on the side of the bed? : Unable Difficulty sitting down on and standing up from a chair with arms (e.g., wheelchair, bedside commode, etc,.)?: Unable Help needed moving to and from a bed to chair (including a wheelchair)?: A Lot Help needed walking in hospital room?: A Lot Help needed climbing 3-5 steps with a railing? : Total 6 Click Score: 8    End of Session Equipment Utilized During Treatment: Gait belt Activity Tolerance: (limited by anxiety/fear of falling) Patient left: in chair;with call bell/phone within reach;with chair alarm set;with family/visitor present Nurse Communication: Mobility status PT Visit Diagnosis: Unsteadiness on feet (R26.81);Muscle weakness (generalized) (M62.81);Difficulty in walking, not elsewhere classified (R26.2);History of falling (Z91.81)    Time: 4098-1191 PT Time Calculation (min) (ACUTE ONLY): 45 min   Charges:   PT Evaluation $PT Eval Moderate Complexity: 1 Mod PT Treatments $Therapeutic Activity: 23-37 mins        Joscelynn Brutus H. Manson Passey, PT, DPT, NCS 01/11/18, 4:09 PM [phone]

## 2018-01-11 NOTE — Consult Note (Signed)
PHARMACY CONSULT NOTE  Pharmacy Consult for Electrolyte Monitoring and Replacement   Recent Labs: Potassium (mmol/L)  Date Value  01/11/2018 3.1 (L)   Magnesium (mg/dL)  Date Value  16/12/9602 3.0 (H)   Calcium (mg/dL)  Date Value  54/11/8117 9.1   Albumin (g/dL)  Date Value  14/78/2956 4.1     Assessment: 77 y.o. female with h/o dementia and multiple UTIs presenting with AKI and UTI. She was hypokalemic on admission (K 3.0) and given KCl po. Her potassium increased slightly to 3.1 this morning  Plan:  Given only a slight improvement with KCl will increase dose to 40 mEq po TID x 3 doses and re-evaluate in the morning  Lowella Bandy ,PharmD Clinical Pharmacist 01/11/2018 10:39 AM

## 2018-01-11 NOTE — Progress Notes (Signed)
Palliative Medicine Consult Order Noted. Due to high referral volume and limited staffing, there will be a delay seeing this patient. Palliative Medicine Provider will return to [facility] on 11/4, and patient will be evaluated then. Please call the Palliative Medicine Team office at [phone] if recommendations are needed in the interim.  Thank you for inviting [place] to see this patient.  Margret Chance Chaynce Schafer, RN, BSN, [facility] Palliative Medicine Team 01/11/2018 1:47 PM Office [phone]

## 2018-01-12 DIAGNOSIS — R531 Weakness: Secondary | ICD-10-CM | POA: Diagnosis not present

## 2018-01-12 DIAGNOSIS — N179 Acute kidney failure, unspecified: Secondary | ICD-10-CM | POA: Diagnosis not present

## 2018-01-12 LAB — URINE CULTURE
CULTURE: NO GROWTH
SPECIAL REQUESTS: NORMAL

## 2018-01-12 LAB — POTASSIUM: Potassium: 3.1 mmol/L — ABNORMAL LOW (ref 3.5–5.1)

## 2018-01-12 MED ORDER — QUETIAPINE FUMARATE 25 MG PO TABS: 25.0000 mg | ORAL_TABLET | Freq: Every day | ORAL | Status: DC

## 2018-01-12 MED ORDER — CIPROFLOXACIN HCL 0.3 % OP SOLN
1.0000 [drp] | OPHTHALMIC | Status: DC
  Filled 2018-01-12: qty 2.5

## 2018-01-12 MED ORDER — CIPROFLOXACIN HCL 0.3 % OP SOLN: 1.0000 [drp] | OPHTHALMIC | 0 refills | Status: DC

## 2018-01-12 MED ORDER — BENZONATATE 100 MG PO CAPS: 100.0000 mg | ORAL_CAPSULE | Freq: Three times a day (TID) | ORAL | Status: DC | PRN

## 2018-01-12 MED ORDER — METOPROLOL SUCCINATE ER 25 MG PO TB24: 25.0000 mg | ORAL_TABLET | Freq: Every evening | ORAL | Status: DC

## 2018-01-12 MED ORDER — VALSARTAN 320 MG PO TABS: 320.0000 mg | ORAL_TABLET | Freq: Every day | ORAL | 11 refills | Status: AC

## 2018-01-12 MED ORDER — POTASSIUM CHLORIDE 20 MEQ PO PACK
40.0000 meq | PACK | ORAL | Status: DC
  Administered 2018-01-12: 40 meq via ORAL
  Filled 2018-01-12: qty 2

## 2018-01-12 MED ORDER — SIMVASTATIN 20 MG PO TABS
20.0000 mg | ORAL_TABLET | Freq: Every day | ORAL | Status: DC
  Filled 2018-01-12: qty 1

## 2018-01-12 MED ORDER — QUETIAPINE FUMARATE 25 MG PO TABS: 12.5000 mg | ORAL_TABLET | Freq: Every day | ORAL | Status: DC

## 2018-01-12 MED ORDER — QUETIAPINE FUMARATE 25 MG PO TABS: 12.5000 mg | ORAL_TABLET | ORAL | Status: DC

## 2018-01-12 MED ORDER — FLUTICASONE PROPIONATE 50 MCG/ACT NA SUSP
1.0000 | Freq: Every day | NASAL | Status: DC
  Administered 2018-01-12: 1 via NASAL
  Filled 2018-01-12: qty 16

## 2018-01-12 MED ORDER — POLYVINYL ALCOHOL 1.4 % OP SOLN: 1.0000 [drp] | OPHTHALMIC | 0 refills | Status: AC | PRN

## 2018-01-12 MED ORDER — DIVALPROEX SODIUM 250 MG PO DR TAB
250.0000 mg | DELAYED_RELEASE_TABLET | Freq: Two times a day (BID) | ORAL | Status: DC
  Filled 2018-01-12 (×2): qty 1

## 2018-01-12 MED ORDER — POLYVINYL ALCOHOL 1.4 % OP SOLN
1.0000 [drp] | OPHTHALMIC | Status: DC | PRN
  Filled 2018-01-12: qty 15

## 2018-01-12 MED ORDER — MEMANTINE HCL 5 MG PO TABS: 10.0000 mg | ORAL_TABLET | Freq: Two times a day (BID) | ORAL | Status: DC

## 2018-01-12 MED ORDER — IRBESARTAN 150 MG PO TABS: 300.0000 mg | ORAL_TABLET | Freq: Every day | ORAL | Status: DC

## 2018-01-12 MED ORDER — CALCIUM CARBONATE ANTACID 500 MG PO CHEW: 1.5000 | CHEWABLE_TABLET | ORAL | Status: DC | PRN

## 2018-01-12 NOTE — Progress Notes (Signed)
Patient cleared for discharge. Case management set up home resources. IV's removed. Instructions given to patient husband. Prescriptions given and pharmacy verified. D/c to home via POV.

## 2018-01-12 NOTE — Consult Note (Signed)
PHARMACY CONSULT NOTE  Pharmacy Consult for Electrolyte Monitoring and Replacement   Recent Labs: Potassium (mmol/L)  Date Value  01/12/2018 3.1 (L)   Magnesium (mg/dL)  Date Value  16/12/9602 3.0 (H)   Calcium (mg/dL)  Date Value  54/11/8117 9.1   Albumin (g/dL)  Date Value  14/78/2956 4.1     Assessment: 77 y.o. female with h/o dementia and multiple UTIs presenting with AKI and UTI. She was hypokalemic on admission (K 3.0). Her potassium increased slightly to 3.1 this morning  Plan:  11/2 K: 3.1. Patient only received 1 dose of KCL yesterday even though 3 doses ordered. Will order KCL x 2 doses today.   Will  re-evaluate in the morning  Gardner Candle, PharmD, BCPS Clinical Pharmacist 01/12/2018 8:24 AM

## 2018-01-12 NOTE — Discharge Summary (Signed)
[facility] - Shannon Rios at [facility]   PATIENT NAME: Shannon Rios    MR#:  [id number]  DATE OF BIRTH:  Sep 08, 1940  DATE OF ADMISSION:  01/10/2018 ADMITTING PHYSICIAN: Shannon Highland, MD  DATE OF DISCHARGE: 01/12/2018  PRIMARY CARE PHYSICIAN: Shannon Farber, MD    ADMISSION DIAGNOSIS:  Pain [R52]  DISCHARGE DIAGNOSIS:  acute renal failure due to dehydration improved advanced dementia  SECONDARY DIAGNOSIS:   Past Medical History:  Diagnosis Date  . Dementia (HCC)   . Diffuse cystic mastopathy   . GERD (gastroesophageal reflux disease)   . Heart murmur 2005  . Hypertension 2009  . 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 Personal history of tobacco use, presenting hazards to health   . Special screening for malignant neoplasms, colon     HOSPITAL COURSE:   Shannon Rios a77 y.o.femalewith a known history of dementia. As per the husband she has been declining over the past for 5 weeks. She has been having pain all over. Her left shoulder hurts. She has had 2 UTIs and finished up antibiotics last Wednesday.  1. Acute kidney injury and dehydration.  -received IV fluid hydration. -Creatinine improving. Patient tolerating PO as well. DC IV fluids.  2. Progressive dementia. Patient made a DO NOT RESUSCITATE.   Husband would like to take the patient home. Physical therapy evaluation appreciated. HHPT  3. Knee pain and shoulder pain.   Tylenol as needed. Hopefully can do a low-dose anti-inflammatory once kidney function is better.  shoulder x-ray did not show any abnormalities acutely   4. Possible UTI.  urine analysis much impressive. On IV Rocephin x3 doses Since urine culture is negative will discontinue antibiotic since patient just finished the course of antibiotic as out pt  5. Hypokalemia oral potassium will also give magnesium   6. HTN d/c hctz Cont metoprolol and valsartan --d/w husband  7. Left eye congestion cipro Eye  drops x 5 days  D/c home Husband agreeable with plan CONSULTS OBTAINED:    DRUG ALLERGIES:  No Known Allergies  DISCHARGE MEDICATIONS:   Allergies as of 01/12/2018   No Known Allergies     Medication List    STOP taking these medications   valsartan-hydrochlorothiazide 320-25 MG tablet Commonly known as:  DIOVAN-HCT     TAKE these medications   benzonatate 100 MG capsule Commonly known as:  TESSALON Take 100-200 mg by mouth 3 (three) times daily as needed for cough.   calcium carbonate 750 MG chewable tablet Commonly known as:  TUMS EX Chew 1-2 tablets by mouth every 2 (two) hours as needed for heartburn.   ciprofloxacin 0.3 % ophthalmic solution Commonly known as:  CILOXAN Place 1 drop into the left eye every 4 (four) hours while awake. Administer 1 drop, every 2 hours, while awake, for 2 days. Then 1 drop, every 4 hours, while awake, for the next 5 days.   divalproex 250 MG DR tablet Commonly known as:  DEPAKOTE Take 250 mg by mouth 2 (two) times daily.   memantine 10 MG tablet Commonly known as:  NAMENDA Take 10 mg by mouth 2 (two) times daily.   metoprolol succinate 25 MG 24 hr tablet Commonly known as:  TOPROL-XL Take 25 mg by mouth every evening.   polyvinyl alcohol 1.4 % ophthalmic solution Commonly known as:  LIQUIFILM TEARS Place 1 drop into both eyes as needed for dry eyes.   QUEtiapine 25 MG tablet Commonly known as:  SEROQUEL Take 12.5-25 mg by mouth  See admin instructions. Take  tablet (12.5MG ) by mouth every morning and 1 tablet (25MG ) by mouth at bedtime   simvastatin 20 MG tablet Commonly known as:  ZOCOR Take 20 mg by mouth daily.   valsartan 320 MG tablet Commonly known as:  DIOVAN Take 1 tablet (320 mg total) by mouth daily.       If you experience worsening of your admission symptoms, develop shortness of breath, life threatening emergency, suicidal or homicidal thoughts you must seek medical attention immediately by calling 911  or calling your MD immediately  if symptoms less severe.  You Must read complete instructions/literature along with all the possible adverse reactions/side effects for all the Medicines you take and that have been prescribed to you. Take any new Medicines after you have completely understood and accept all the possible adverse reactions/side effects.   Please note  You were cared for by a hospitalist during your hospital stay. If you have any questions about your discharge medications or the care you received while you were in the hospital after you are discharged, you can call the unit and asked to speak with the hospitalist on call if the hospitalist that took care of you is not available. Once you are discharged, your primary care physician will handle any further medical issues. Please note that NO REFILLS for any discharge medications will be authorized once you are discharged, as it is imperative that you return to your primary care physician (or establish a relationship with a primary care physician if you do not have one) for your aftercare needs so that they can reassess your need for medications and monitor your lab values. Today   SUBJECTIVE   No new issues. Left Eye crusting and redness+  VITAL SIGNS:  Blood pressure (!) 167/86, pulse 86, temperature 97.7 F (36.5 C), temperature source Oral, resp. rate 16, height 5\' 6"  (1.676 m), weight 76.9 kg, SpO2 98 %.  I/O:    Intake/Output Summary (Last 24 hours) at 01/12/2018 1107 Last data filed at 01/12/2018 1033 Gross per 24 hour  Intake 1918.07 ml  Output 910 ml  Net 1008.07 ml    PHYSICAL EXAMINATION:  GENERAL:  77 y.o.-year-old patient lying in the bed with no acute distress.  EYES: Pupils equal, round, reactive to light and accommodation. No scleral icterus. Extraocular muscles intact.  HEENT: Head atraumatic, normocephalic. Oropharynx and nasopharynx clear. Left eye congestion with eye crusting NECK:  Supple, no jugular  venous distention. No thyroid enlargement, no tenderness.  LUNGS: Normal breath sounds bilaterally, no wheezing, rales,rhonchi or crepitation. No use of accessory muscles of respiration.  CARDIOVASCULAR: S1, S2 normal. No murmurs, rubs, or gallops.  ABDOMEN: Soft, non-tender, non-distended. Bowel sounds present. No organomegaly or mass.  EXTREMITIES: No pedal edema, cyanosis, or clubbing.  NEUROLOGIC: Cranial nerves II through XII are intact. Muscle strength 5/5 in all extremities. Sensation intact. Gait not checked.  PSYCHIATRIC: The patient is alert and oriented x 3.  SKIN: No obvious rash, lesion, or ulcer.   DATA REVIEW:   CBC  Recent Labs  Lab 01/11/18 0350  WBC 8.5  HGB 12.2  HCT 37.3  PLT 449*    Chemistries  Recent Labs  Lab 01/10/18 1604 01/11/18 0350 01/12/18 0439  NA 133* 138  --   K 3.0* 3.1* 3.1*  CL 86* 94*  --   CO2 33* 33*  --   GLUCOSE 142* 117*  --   BUN 24* 22  --   CREATININE 2.15* 1.17*  --  CALCIUM 9.8 9.1  --   MG  --  3.0*  --   AST 18  --   --   ALT 20  --   --   ALKPHOS 74  --   --   BILITOT 0.5  --   --     Microbiology Results   Recent Results (from the past 240 hour(s))  Urine Culture     Status: None   Collection Time: 01/10/18  7:29 PM  Result Value Ref Range Status   Specimen Description   Final    URINE, RANDOM Performed at [facility], [address] [place], [place] [address]    Special Requests   Final    Normal Performed at [facility], [address], [place], [place] [address]    Culture   Final    NO GROWTH Performed at [facility], [address] [place], [place] [address]    Report Status 01/12/2018 FINAL  Final    RADIOLOGY:  Dg Chest 2 View  Result Date: 01/10/2018 CLINICAL DATA:  Weakness and falls. EXAM: CHEST - 2 VIEW COMPARISON:  CT scan September 07, 2017 FINDINGS: The heart size and mediastinal contours are within normal limits. Both lungs are clear. The visualized skeletal  structures are unremarkable. IMPRESSION: No active cardiopulmonary disease. Electronically Signed   By: Gerome Sam III M.D   On: 01/10/2018 16:33   Dg Shoulder Left  Result Date: 01/10/2018 CLINICAL DATA:  Left shoulder pain.  No known injury. EXAM: LEFT SHOULDER - 2+ VIEW COMPARISON:  None. FINDINGS: There is no evidence of fracture or dislocation. There is no evidence of arthropathy or other focal bone abnormality. Soft tissues are unremarkable. IMPRESSION: No fracture, dislocation or arthropathy of the left shoulder. Electronically Signed   By: Deatra Robinson M.D.   On: 01/10/2018 20:33     Management plans discussed with the patient, family and they are in agreement.  CODE STATUS:     Code Status Orders  (From admission, onward)         Start     Ordered   01/10/18 1943  Do not attempt resuscitation (DNR)  Continuous    Question Answer Comment  In the event of cardiac or respiratory ARREST Do not call a "code blue"   In the event of cardiac or respiratory ARREST Do not perform Intubation, CPR, defibrillation or ACLS   In the event of cardiac or respiratory ARREST Use medication by any route, position, wound care, and other measures to relive pain and suffering. May use oxygen, suction and manual treatment of airway obstruction as needed for comfort.   Comments nurse may pronounce      01/10/18 1943        Code Status History    This patient has a current code status but no historical code status.    Advance Directive Documentation     Most Recent Value  Type of Advance Directive  Healthcare Power of Attorney  Pre-existing out of facility DNR order (yellow form or pink MOST form)  -  "MOST" Form in Place?  -      TOTAL TIME TAKING CARE OF THIS PATIENT: *40* minutes.    Enedina Finner M.D on 01/12/2018 at 11:07 AM  Between 7am to 6pm - Pager - [phone] After 6pm go to [email or web address] - Social research officer, government  [facility]  Office   [phone]  CC: Primary care physician; Shannon Farber, MD

## 2018-01-29 ENCOUNTER — Encounter: Admission: RE | Payer: Self-pay | Source: Ambulatory Visit

## 2018-01-29 ENCOUNTER — Ambulatory Visit: Admission: RE | Admit: 2018-01-29 | Payer: Medicare PPO | Source: Ambulatory Visit | Admitting: Internal Medicine

## 2018-01-29 SURGERY — COLONOSCOPY WITH PROPOFOL
Anesthesia: General

## 2018-01-31 ENCOUNTER — Ambulatory Visit: Payer: 59

## 2018-01-31 ENCOUNTER — Ambulatory Visit
Admission: EM | Admit: 2018-01-31 | Discharge: 2018-01-31 | Disposition: A | Discharge status: Home / Self Care | Payer: 59 | Attending: Family Medicine | Admitting: Family Medicine

## 2018-01-31 ENCOUNTER — Ambulatory Visit (INDEPENDENT_AMBULATORY_CARE_PROVIDER_SITE_OTHER): Payer: 59

## 2018-01-31 ENCOUNTER — Encounter: Payer: Self-pay | Admitting: Emergency Medicine

## 2018-01-31 ENCOUNTER — Other Ambulatory Visit: Payer: Self-pay

## 2018-01-31 DIAGNOSIS — W1830XA Fall on same level, unspecified, initial encounter: Secondary | ICD-10-CM | POA: Diagnosis not present

## 2018-01-31 DIAGNOSIS — S63501A Unspecified sprain of right wrist, initial encounter: Secondary | ICD-10-CM

## 2018-01-31 DIAGNOSIS — S40021A Contusion of right upper arm, initial encounter: Secondary | ICD-10-CM | POA: Diagnosis not present

## 2018-01-31 NOTE — Discharge Instructions (Signed)
Apply ice 20 minutes out of every 2 hours 4-5 times daily for comfort.  °

## 2018-01-31 NOTE — ED Triage Notes (Signed)
Husband states patient fell this morning transferring from the bed to the bedside commode. She fell on her left side injuring her right shoulder and right wrist.

## 2018-01-31 NOTE — ED Provider Notes (Signed)
[facility]    CSN: [id number] Arrival date & time: 01/31/18  1526     History   Chief Complaint Chief Complaint  Patient presents with  . Fall  . Shoulder Pain  . Wrist Pain    HPI Shannon Rios is a 77 y.o. female.   HPI  A 77 year old female accompanied by her husband presents with an injury to her right shoulder and right wrist.  Was assisting her to the bathroom last night though she has a bedside commode, she wanted to proceed on to the bathroom and they stumbled and he fell with her.  She landed on her right side.  After the fall began complaining of right upper arm pain specifically the wrist and shoulder as well as the humerus.  He has advanced  dementia the history was obtained from her husband.         Past Medical History:  Diagnosis Date  . Dementia (HCC)   . Diffuse cystic mastopathy   . GERD (gastroesophageal reflux disease)   . Heart murmur 2005  . Hypertension 2009  . 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 Personal history of tobacco use, presenting hazards to health   . Special screening for malignant neoplasms, colon     Patient Active Problem List   Diagnosis Date Noted  . Acute kidney injury (HCC) 01/10/2018  . Urinary incontinence 09/20/2017  . Lymphedema of both lower extremities 07/24/2017  . Mood disorder (HCC) 09/25/2016  . Primary osteoarthritis of left knee 09/08/2016  . Primary osteoarthritis of right knee 09/08/2016  . Primary osteoarthritis of both knees 07/19/2016  . Moderate dementia, with behavioral disturbance (HCC) 06/04/2016  . DDD (degenerative disc disease), cervical 01/18/2016  . Vitamin D deficiency 07/15/2014  . Allergic rhinitis 01/12/2014  . Osteopenia 01/12/2014  . Hyperlipidemia 01/07/2014  . Impaired glucose tolerance 01/07/2014  . Hypertension 09/01/2013  . Diffuse cystic mastopathy 12/23/2012    Past Surgical History:  Procedure Laterality Date  . ABDOMINAL HYSTERECTOMY  1980   with  salpingo oophorectomy  . BREAST BIOPSY Bilateral    neg  . BREAST SURGERY  2005   biopsy  . COLONOSCOPY  2010   Dr. Evette Cristal  . HERNIA REPAIR    . THYROIDECTOMY, PARTIAL  2005    OB History    Gravida  1   Para      Term      Preterm      AB  1   Living        SAB  1   TAB      Ectopic      Multiple      Live Births           Obstetric Comments  Age with first menstruation-14 LMP-1980, hysterectomy  Age at first pregnancy: 55?         Home Medications    Prior to Admission medications   Medication Sig Start Date End Date Taking? Authorizing Provider  benzonatate (TESSALON) 100 MG capsule Take 100-200 mg by mouth 3 (three) times daily as needed for cough. 12/21/17  Yes [provider]  calcium carbonate (TUMS EX) 750 MG chewable tablet Chew 1-2 tablets by mouth every 2 (two) hours as needed for heartburn.   Yes [provider]  divalproex (DEPAKOTE) 250 MG DR tablet Take 250 mg by mouth 2 (two) times daily. 12/23/17  Yes [provider]  metoprolol succinate (TOPROL-XL) 25 MG 24 hr tablet Take 25 mg  by mouth every evening. 10/30/17  Yes [provider]  polyvinyl alcohol (LIQUIFILM TEARS) 1.4 % ophthalmic solution Place 1 drop into both eyes as needed for dry eyes. 01/12/18  Yes Enedina Finner, MD  QUEtiapine (SEROQUEL) 25 MG tablet Take 12.5-25 mg by mouth See admin instructions. Take  tablet (12.5MG ) by mouth every morning and 1 tablet (25MG ) by mouth at bedtime 01/08/18  Yes [provider]  valsartan (DIOVAN) 320 MG tablet Take 1 tablet (320 mg total) by mouth daily. 01/12/18 01/12/19 Yes Enedina Finner, MD    Family History Family History  Problem Relation Age of Onset  . Diabetes Mother   . Alzheimer's disease Mother   . CAD Father     Social History Social History   Tobacco Use  . Smoking status: Former Smoker    Packs/day: 0.50    Years: 10.00    Pack years: 5.00    Types: Cigarettes  . Smokeless  tobacco: Never Used  Substance Use Topics  . Alcohol use: Not Currently    Comment: socially  . Drug use: No     Allergies   Patient has no known allergies.   Review of Systems Review of Systems  Constitutional: Positive for activity change. Negative for appetite change, chills, fatigue and fever.  Musculoskeletal: Positive for gait problem and myalgias.  All other systems reviewed and are negative.    Physical Exam Triage Vital Signs ED Triage Vitals  Enc Vitals Group     BP 01/31/18 1547 108/73     Pulse Rate 01/31/18 1547 98     Resp 01/31/18 1547 18     Temp 01/31/18 1547 97.6 F (36.4 C)     Temp Source 01/31/18 1547 Oral     SpO2 01/31/18 1547 97 %     Weight 01/31/18 1543 178 lb (80.7 kg)     Height 01/31/18 1543 5\' 6"  (1.676 m)     Head Circumference --      Peak Flow --      Pain Score --      Pain Loc --      Pain Edu? --      Excl. in GC? --    No data found.  Updated Vital Signs BP 108/73 (BP Location: Right Arm)   Pulse 98   Temp 97.6 F (36.4 C) (Oral)   Resp 18   Ht 5\' 6"  (1.676 m)   Wt 178 lb (80.7 kg)   SpO2 97%   BMI 28.73 kg/m   Visual Acuity Right Eye Distance:   Left Eye Distance:   Bilateral Distance:    Right Eye Near:   Left Eye Near:    Bilateral Near:     Physical Exam  Constitutional: She is oriented to person, place, and time. She appears well-developed and well-nourished. No distress.  HENT:  Head: Normocephalic and atraumatic.  Eyes: Pupils are equal, round, and reactive to light. Right eye exhibits no discharge. Left eye exhibits no discharge.  Neck: Normal range of motion. Neck supple.  Musculoskeletal: Normal range of motion. She exhibits tenderness.  Lamination of the right upper extremity shows no ecchymosis significant swelling or erythema.  Range of motion of the shoulder shows a full range shows full range of motion flexion extension pronation supination.  Examination of the wrist shows tenderness that is  generalized.  She has fairly good range of motion.  He does complain of tenderness with palpation of the humerus at the junction of the upper  and middle thirds.  Neurological: She is alert and oriented to person, place, and time.  Skin: Skin is warm and dry. She is not diaphoretic.  Psychiatric: She has a normal mood and affect. Her behavior is normal. Judgment and thought content normal.  Nursing note and vitals reviewed.    UC Treatments / Results  Labs (all labs ordered are listed, but only abnormal results are displayed) Labs Reviewed - No data to display  EKG None  Radiology Dg Shoulder Right  Result Date: 01/31/2018 CLINICAL DATA:  Husband states patient fell this morning transferring from the bed to the bedside commode. She fell on her left side injuring her right shoulder and right wrist. EXAM: RIGHT SHOULDER - 2+ VIEW COMPARISON:  None. FINDINGS: There is no evidence of fracture or dislocation. There is no evidence of arthropathy or other focal bone abnormality. Soft tissues are unremarkable. IMPRESSION: Negative. Electronically Signed   By: Norva PavlovElizabeth  Brown M.D.   On: 01/31/2018 16:40   Dg Wrist Complete Right  Result Date: 01/31/2018 CLINICAL DATA:  Husband states patient fell this morning transferring from the bed to the bedside commode. She fell on her left side injuring her right shoulder and right wrist. EXAM: RIGHT WRIST - COMPLETE 3+ VIEW COMPARISON:  None. FINDINGS: There is no evidence of fracture or dislocation. There is no evidence of arthropathy or other focal bone abnormality. Soft tissues are unremarkable. IMPRESSION: Negative. Electronically Signed   By: Norva PavlovElizabeth  Brown M.D.   On: 01/31/2018 16:41   Dg Humerus Right  Result Date: 01/31/2018 CLINICAL DATA:  Husband states patient fell this morning transferring from the bed to the bedside commode. She fell on her left side injuring her right shoulder and right wrist. EXAM: RIGHT HUMERUS - 2+ VIEW COMPARISON:   None. FINDINGS: There is no evidence of fracture or other focal bone lesions. Soft tissues are unremarkable. IMPRESSION: Negative. Electronically Signed   By: Norva PavlovElizabeth  Brown M.D.   On: 01/31/2018 16:39    Procedures Procedures (including critical care time)  Medications Ordered in UC Medications - No data to display  Initial Impression / Assessment and Plan / UC Course  I have reviewed the triage vital signs and the nursing notes.  Pertinent labs & imaging results that were available during my care of the patient were reviewed by me and considered in my medical decision making (see chart for details).     I reviewed the x-rays with the husband.  Fracture dislocation seen.  It is likely she has just sustained contusions.  Offered them a wrist splint for comfort as well as a sling but the he declined.  Will use Tylenol for pain control.  The husband states that she has no follow[facility]. Final Clinical Impressions(s) / UC Diagnoses   Final diagnoses:  Contusion of right upper arm, initial encounter  Sprain of right wrist, initial encounter     Discharge Instructions     Apply ice 20 minutes out of every 2 hours 4-5 times daily for comfort.     ED Prescriptions    None     Controlled Substance Prescriptions [place] Controlled Substance Registry consulted? Not Applicable   Lutricia FeilRoemer, Issacc Merlo P, PA-C 01/31/18 2124

## 2018-02-12 ENCOUNTER — Telehealth: Payer: Self-pay | Admitting: Student

## 2018-02-12 NOTE — Telephone Encounter (Signed)
NP phone call: spoke with husband, Mr. Pricilla Holmucker to follow up on previous visit and discussion of Hospice. He reports patient declining further. He states she did receive therapy, but it has not made any difference. He also reports that he is awaiting results to see if she has another urinary tract infection. He is agreeable to proceeding with Hospice assessment at this time.

## 2018-03-13 DEATH — deceased

## 2019-10-11 IMAGING — CR DG SHOULDER 2+V*L*
3 series · 3 of 3 positions shown · non-contrast
Comparison: None.

CLINICAL DATA: Left shoulder pain.  No known injury.

EXAM:
LEFT SHOULDER - 2+ VIEW

[shoulder grashey]
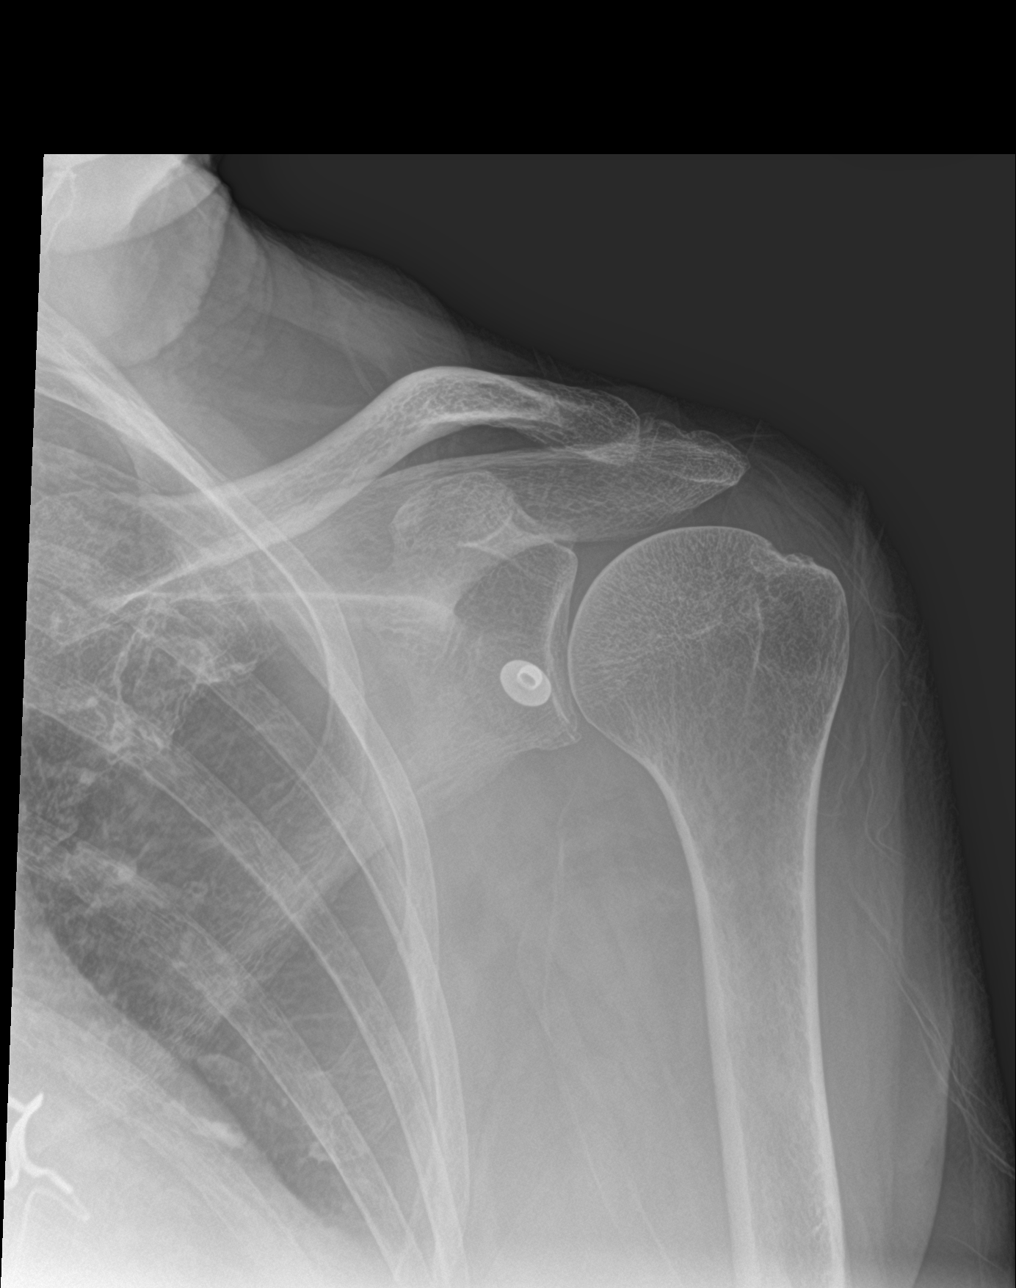

[shoulder y view]
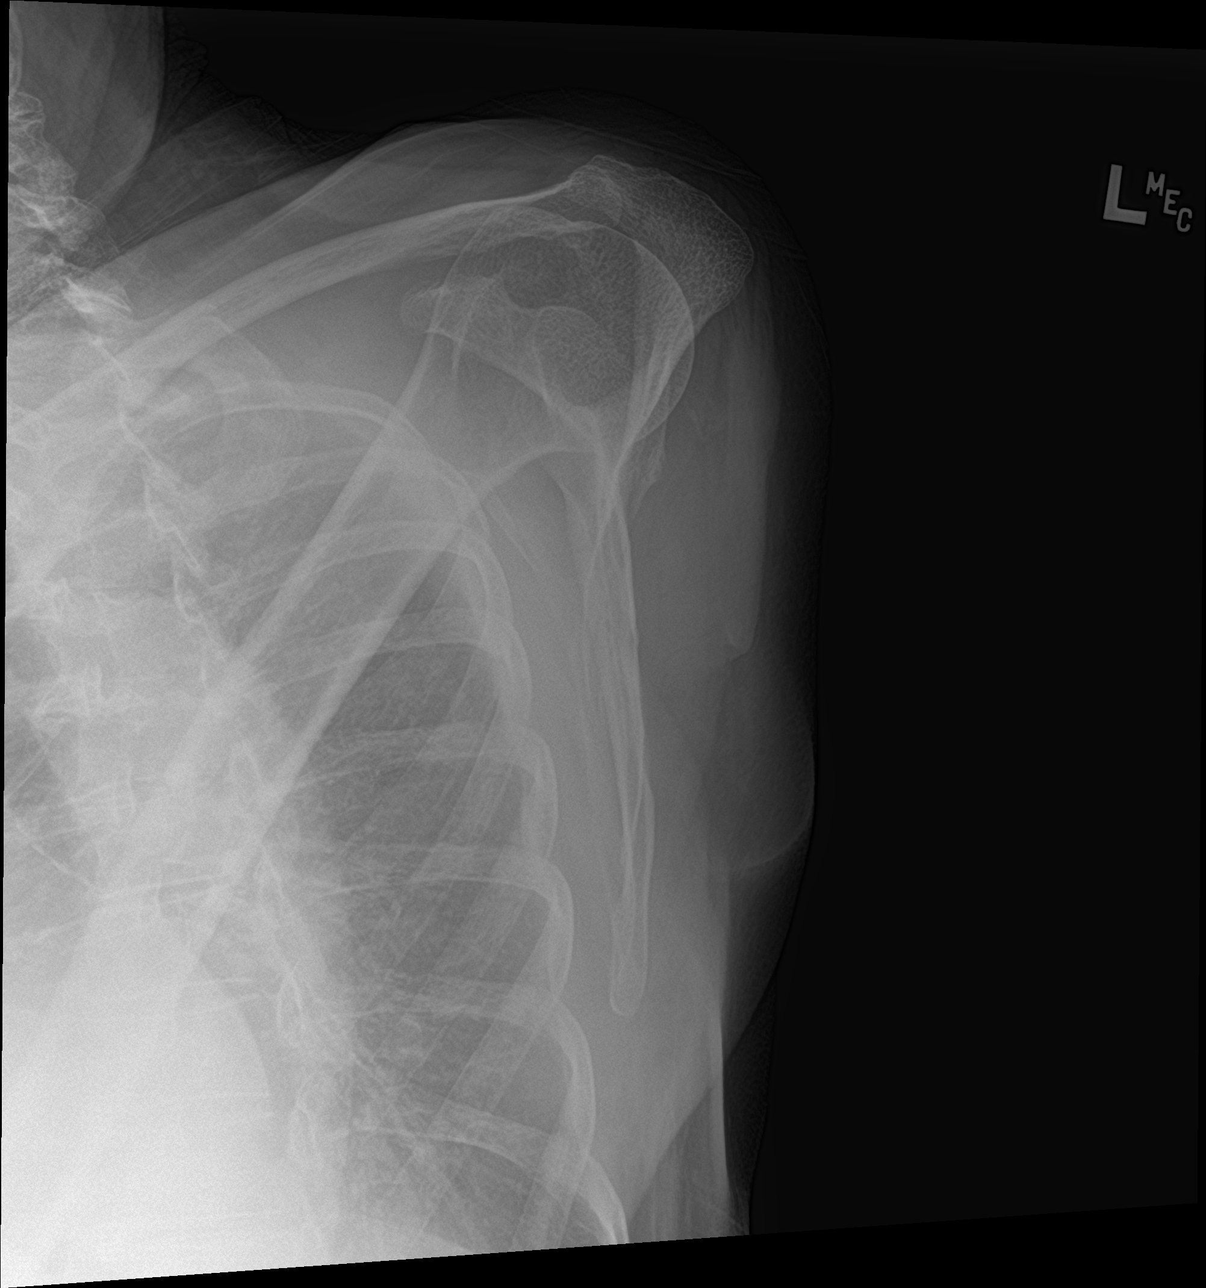

[shoulder axillary]
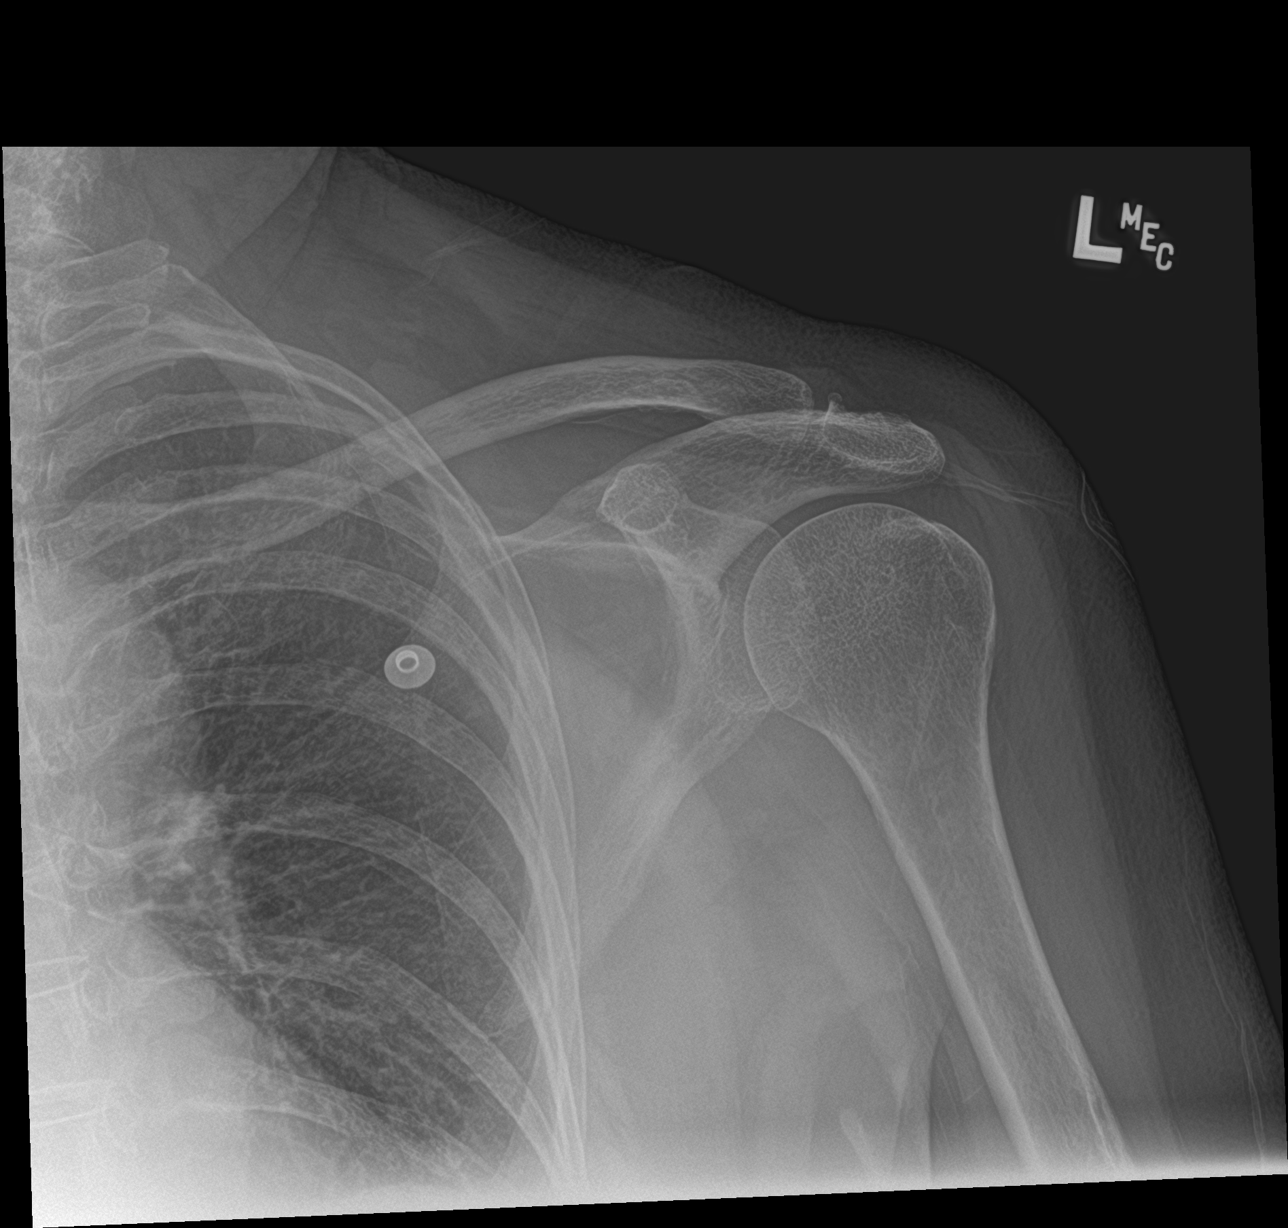

[3 of 3 positions shown; findings below may reference images not displayed]

FINDINGS: There is no evidence of fracture or dislocation. There is no
evidence of arthropathy or other focal bone abnormality. Soft
tissues are unremarkable.
IMPRESSION: No fracture, dislocation or arthropathy of the left shoulder.

## 2019-10-11 IMAGING — CR DG CHEST 2V
1 series · 2 of 2 positions shown · non-contrast
Comparison: CT scan September 07, 2017

CLINICAL DATA: Weakness and falls.

EXAM:
CHEST - 2 VIEW

[Series 1: dg chest 2 view · 0.14mm/px · 2 of 2 slices shown]
[im 1/2]
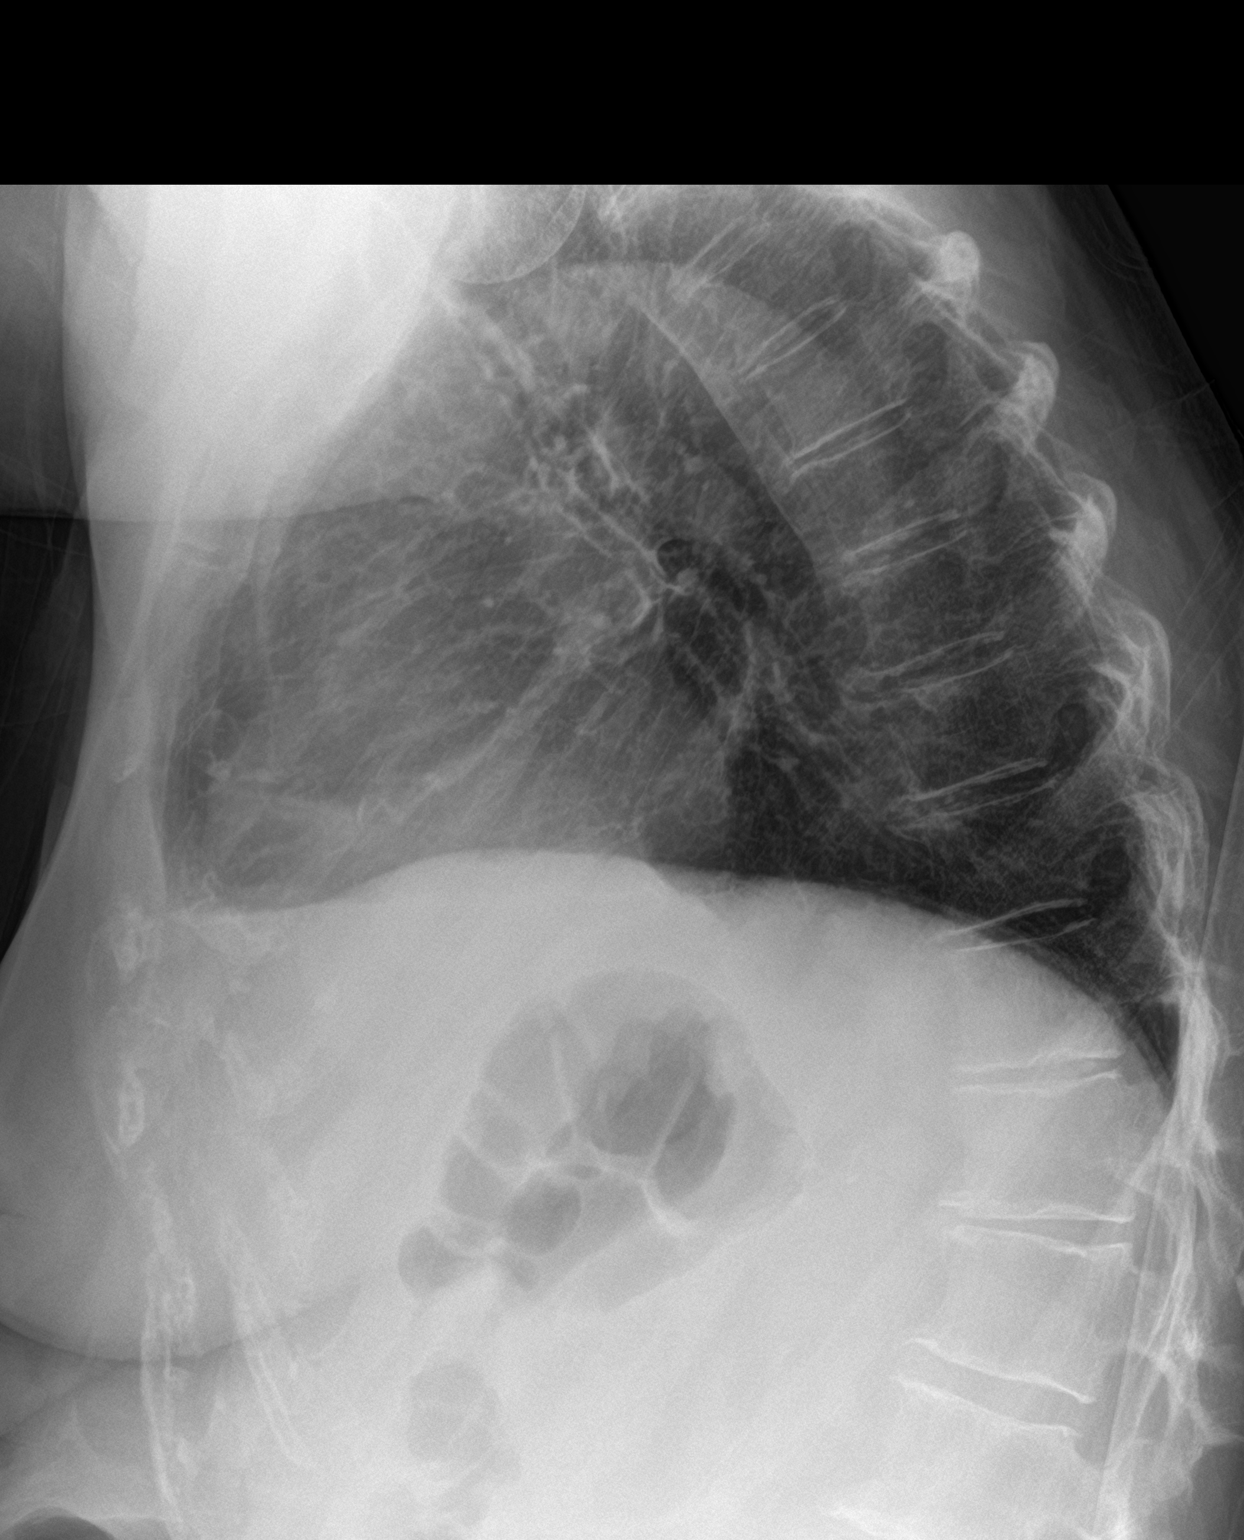
[im 2/2]
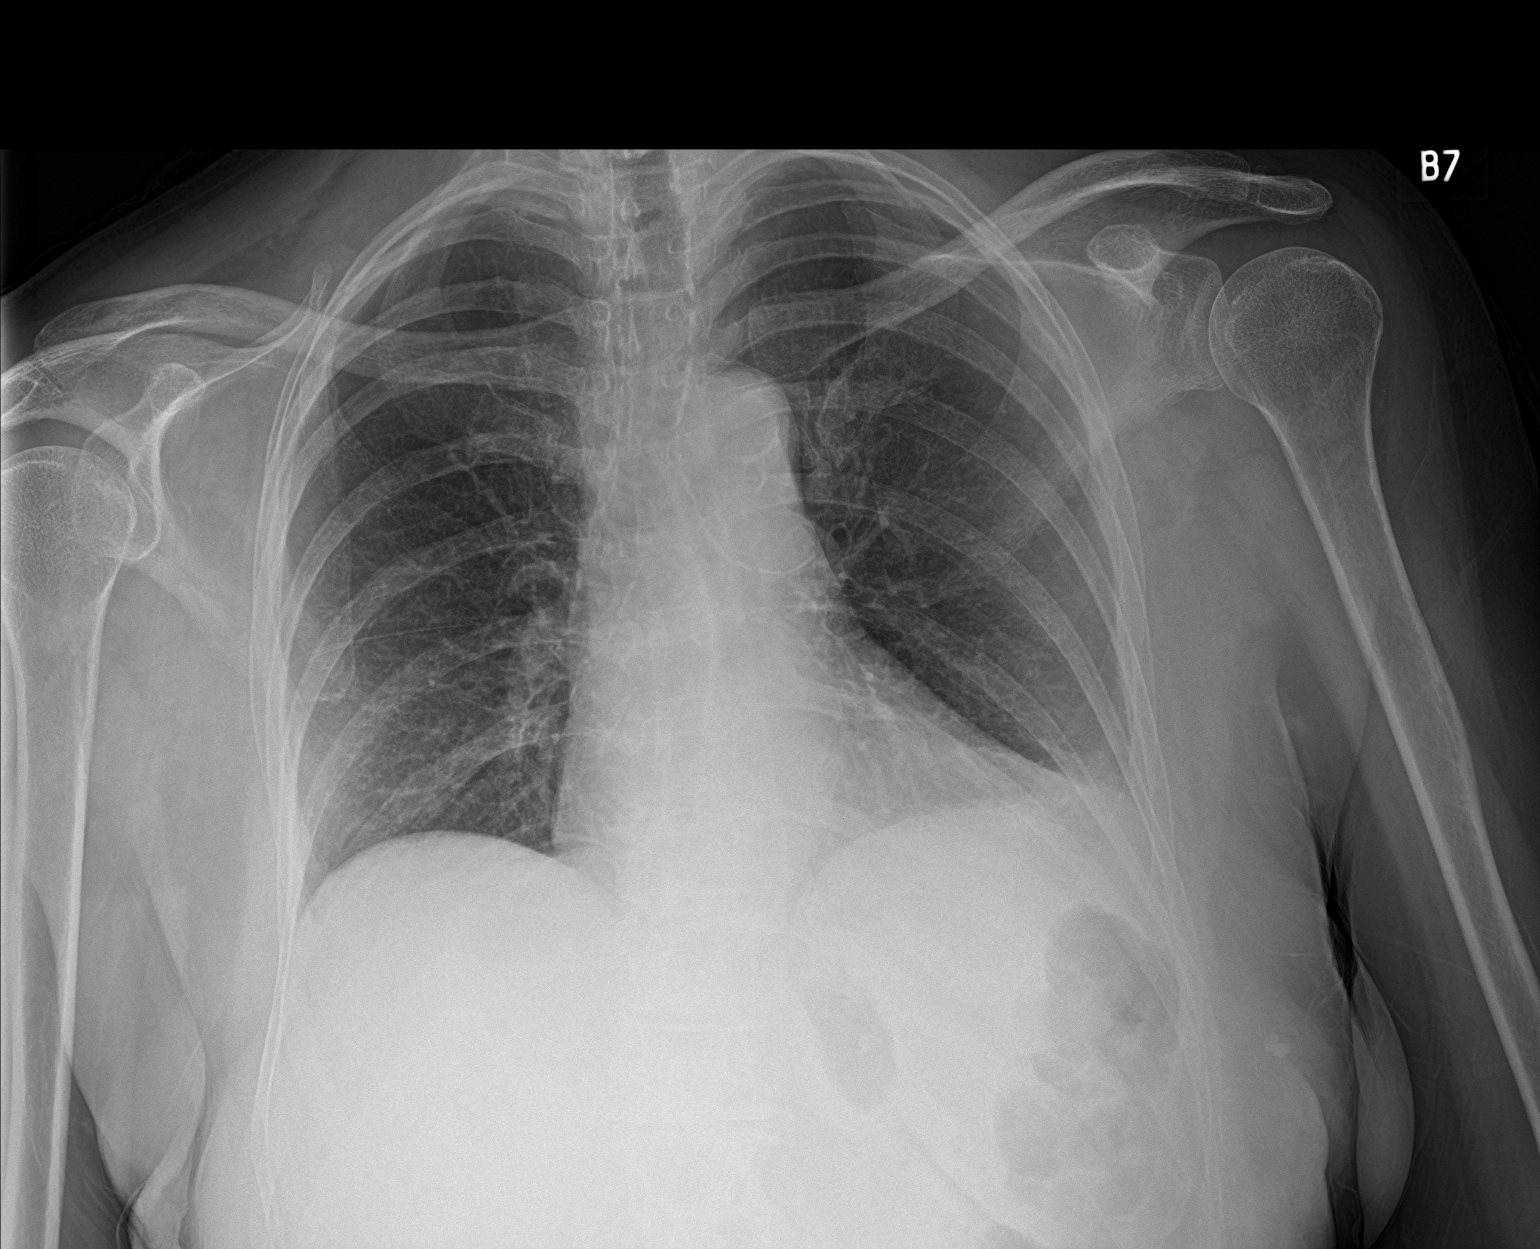

[2 of 2 positions shown; findings below may reference images not displayed]

FINDINGS: The heart size and mediastinal contours are within normal limits.
Both lungs are clear. The visualized skeletal structures are
unremarkable.
IMPRESSION: No active cardiopulmonary disease.
